# Patient Record
Sex: Female | Born: 1990 | Race: Black or African American | Hispanic: No | Smoking: Former smoker
Health system: Southern US, Community
[De-identification: ages and names within clinical notes are randomized; demographics above are authoritative.]

## PROBLEM LIST (undated history)

## (undated) DIAGNOSIS — F909 Attention-deficit hyperactivity disorder, unspecified type: Secondary | ICD-10-CM

## (undated) DIAGNOSIS — O24419 Gestational diabetes mellitus in pregnancy, unspecified control: Secondary | ICD-10-CM

## (undated) DIAGNOSIS — N920 Excessive and frequent menstruation with regular cycle: Secondary | ICD-10-CM

## (undated) DIAGNOSIS — R51 Headache: Secondary | ICD-10-CM

## (undated) DIAGNOSIS — F32A Depression, unspecified: Secondary | ICD-10-CM

## (undated) DIAGNOSIS — M5430 Sciatica, unspecified side: Secondary | ICD-10-CM

## (undated) DIAGNOSIS — F329 Major depressive disorder, single episode, unspecified: Secondary | ICD-10-CM

## (undated) HISTORY — DX: Major depressive disorder, single episode, unspecified: F32.9

## (undated) HISTORY — PX: NO PAST SURGERIES: SHX2092

## (undated) HISTORY — DX: Excessive and frequent menstruation with regular cycle: N92.0

## (undated) HISTORY — DX: Gestational diabetes mellitus in pregnancy, unspecified control: O24.419

## (undated) HISTORY — DX: Attention-deficit hyperactivity disorder, unspecified type: F90.9

## (undated) HISTORY — DX: Depression, unspecified: F32.A

## (undated) HISTORY — DX: Headache: R51

---
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
## 2010-06-29 ENCOUNTER — Ambulatory Visit: Payer: Self-pay | Admitting: Family Medicine

## 2010-06-29 DIAGNOSIS — N920 Excessive and frequent menstruation with regular cycle: Secondary | ICD-10-CM

## 2010-06-29 DIAGNOSIS — R519 Headache, unspecified: Secondary | ICD-10-CM | POA: Insufficient documentation

## 2010-06-29 DIAGNOSIS — F909 Attention-deficit hyperactivity disorder, unspecified type: Secondary | ICD-10-CM | POA: Insufficient documentation

## 2010-06-29 DIAGNOSIS — R51 Headache: Secondary | ICD-10-CM | POA: Insufficient documentation

## 2010-06-29 DIAGNOSIS — F329 Major depressive disorder, single episode, unspecified: Secondary | ICD-10-CM

## 2010-06-29 DIAGNOSIS — F3289 Other specified depressive episodes: Secondary | ICD-10-CM | POA: Insufficient documentation

## 2010-06-29 HISTORY — DX: Excessive and frequent menstruation with regular cycle: N92.0

## 2010-06-30 LAB — CONVERTED CEMR LAB
ALT: 22 units/L (ref 0–35)
AST: 20 units/L (ref 0–37)
Albumin: 3.8 g/dL (ref 3.5–5.2)
Alkaline Phosphatase: 77 units/L (ref 39–117)
BUN: 8 mg/dL (ref 6–23)
Chloride: 98 meq/L (ref 96–112)
Cholesterol: 154 mg/dL (ref 0–200)
GFR calc non Af Amer: 139.7 mL/min (ref >60)
Glucose, Bld: 81 mg/dL (ref 70–99)
Potassium: 3.9 meq/L (ref 3.5–5.1)
Sodium: 140 meq/L (ref 135–145)

## 2010-07-12 ENCOUNTER — Telehealth: Payer: Self-pay | Admitting: Family Medicine

## 2011-01-02 NOTE — Assessment & Plan Note (Signed)
Summary: NEW PT TO BE ESTABLISHED/JRR   Vital Signs:  Patient profile:   20 year old female Height:      68.25 inches Weight:      221 pounds BMI:     33.48 Temp:     98.3 degrees F oral Pulse rate:   76 / minute Pulse rhythm:   regular BP sitting:   118 / 70  (left arm) Cuff size:   regular  Vitals Entered By: Linde Gillis CMA Duncan Dull) (June 29, 2010 1:59 PM) CC: new patient, establish care Research Study Name: Patient will call us back and give dose of Ritalin   History of Present Illness: 20 yo here to establish care.  1.  Migraine headaches- diagnosed years ago, worsened by stress.  School was stressful and was having 1-2 migraines per week last month, just two this month. Usually unilateral, behind her eye. Associated with photophobia and nausea, no vomiting. No focal neurological deficits. Either takes an excedrin migraine or goes to sleep.  2. ADHD- has been on Ritalin since she was 20 years old, cannot remember dose.  Takes a vacation from it during the summer.  Has been told that she was hyperactive as a child but now her main issue is difficulty concentrating.  3.  Menorrhagia- was on OCPs(cannot remember name).  Helped regulate her periods which have been heavy and irregular.  4.  H/o depression- 2 years ago became so depressed that she attemped suicide (slit her wrist).  Mom immediately took her out of school and to a Haematologist.  She has not recurrent episodes of depression and believes it all stemmed from the group of friends she was interacting with.  5. Well woman- sexually active with boyfriend.  Has been having sex since she was 57 yo.  Has had one pap smear two years ago.  They are using condoms.  Current Medications (verified): 1)  Yaz 3-0.02 Mg  Tabs (Drospirenone-Ethinyl Estradiol) .Marland Kitchen.. 1 Tab By Mouth Daily  Allergies (verified): No Known Drug Allergies  Past History:  Family History: Last updated: 06/29/2010 unremarkable  Social History: Last  updated: 06/29/2010 Moved from [place], [place] with mom, dad and brother 2 years ago. 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  Past Medical History: Headache ADHD Depression s/p suicide attempt in 2009 menorrhagia  Past Surgical History: Denies surgical history  Family History: unremarkable  Social History: Moved from [place], [place] with mom, dad and brother 2 years ago. 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  Review of Systems      See HPI General:  Denies malaise. Eyes:  Denies blurring. ENT:  Denies difficulty swallowing. CV:  Denies chest pain or discomfort. Resp:  Denies shortness of breath. GI:  Denies abdominal pain and change in bowel habits. GU:  Complains of abnormal vaginal bleeding; denies discharge, dysuria, and genital sores. MS:  Denies joint pain, joint redness, and joint swelling. Derm:  Denies rash. Neuro:  Complains of headaches; denies numbness, poor balance, seizures, sensation of room spinning, visual disturbances, and weakness. Psych:  Denies anxiety, depression, sense of great danger, suicidal thoughts/plans, thoughts of violence, unusual visions or sounds, and thoughts /plans of harming others. Endo:  Denies cold intolerance and heat intolerance. Heme:  Denies abnormal bruising and bleeding.  Physical Exam  General:  alert, well-developed, well-nourished, and well-hydrated.  Head:  normocephalic and atraumatic.   Eyes:  vision grossly intact, pupils equal, pupils round, and pupils reactive to light.   Ears:  R ear normal and L ear normal.   Nose:  no external deformity.   Mouth:  good dentition.   Neck:  No deformities, masses, or tenderness noted. Lungs:  Normal respiratory effort, chest expands symmetrically. Lungs are clear to auscultation, no  crackles or wheezes. Heart:  Normal rate and regular rhythm. S1 and S2 normal without gallop, murmur, click, rub or other extra sounds. Abdomen:  Bowel sounds positive,abdomen soft and non-tender without masses, organomegaly or hernias noted. Msk:  No deformity or scoliosis noted of thoracic or lumbar spine.   Extremities:  No clubbing, cyanosis, edema, or deformity noted with normal full range of motion of all joints.   Neurologic:  alert & oriented X3 and gait normal.   Skin:  Intact without suspicious lesions or rashes Psych:  Oriented X3, good eye contact, not anxious appearing, and not depressed appearing.     Impression & Recommendations:  Problem # 1:  HEADACHE (ICD-784.0) Assessment New Classic for migraines.  Discussed importance of NOT taking Excedrin migraine on a regular basis.  Unless she is on OCPs, do not feel comfortable placing her on Immitrex or topomax.  Will start OCPs first and she will follow up in one month. Orders: Venipuncture (16109) TLB-BMP (Basic Metabolic Panel-BMET) (80048-METABOL)  Problem # 2:  DEPRESSION (ICD-311) Assessment: Improved Appears to be adjusting better to her new environment.  Discussed importance of reaching out to family or myself if she feels those symptoms again.  Pt expressed understanding.  Problem # 3:  MENORRHAGIA (ICD-626.2) Assessment: New Will start Yaz, hopefully will help migraines as well. Check FLP today as OCPs are contraindicated with hypertriglyceridemia. Her updated medication list for this problem includes:    Yaz 3-0.02 Mg Tabs (Drospirenone-ethinyl estradiol) .Marland Kitchen... 1 tab by mouth daily  Problem # 4:  ADHD (ICD-314.01) Assessment: Unchanged Stable, will call us with dosage of her Ritalin so we can refill her prescription.  Complete Medication List: 1)  Yaz 3-0.02 Mg Tabs (Drospirenone-ethinyl estradiol) .Marland Kitchen.. 1 tab by mouth daily  Other Orders: TLB-Lipid Panel (80061-LIPID) TLB-Hepatic/Liver Function Pnl  (80076-HEPATIC)  Patient Instructions: 1)  It was very nice to meet you. 2)  Please call me in one month to let me know how you are doing with your headaches. Prescriptions: YAZ 3-0.02 MG  TABS (DROSPIRENONE-ETHINYL ESTRADIOL) 1 tab by mouth daily  #1 pack x 11   Entered and Authorized by:   Ruthe Mannan MD   Signed by:   Ruthe Mannan MD on 06/29/2010   Method used:   Print then Give to Patient   RxID:   [id number]   Prior Medications (reviewed today): None Current Allergies (reviewed today): No known allergies   PAP Result Date:  06/10/2008 PAP Result:  historical PAP Next Due:  2 yr   Appended Document: Orders Update    Clinical Lists Changes  Orders: Added new Service order of New Patient Level IV (95621) - Signed

## 2011-01-02 NOTE — Progress Notes (Signed)
Summary: vyvanse  Phone Note Refill Request Call back at Home Phone [phone] Message from:  Patient on July 12, 2010 4:49 PM  Refills Requested: Medication #1:  VYVANSE 30 MG CAPS take one tablet by mouth daily.  Method Requested: Pick up at Office Initial call taken by: Melody Comas,  July 12, 2010 4:49 PM    Prescriptions: VYVANSE 30 MG CAPS (LISDEXAMFETAMINE DIMESYLATE) take one tablet by mouth daily  #30 x 0   Entered and Authorized by:   Ruthe Mannan MD   Signed by:   Ruthe Mannan MD on 07/13/2010   Method used:   Print then Give to Patient   RxID:   [id number]   Appended Document: vyvanse Left message on machine at home that Rx was ready for pick up will be left at front desk.

## 2011-01-02 NOTE — Miscellaneous (Signed)
Summary: Vaccine Records  Vaccine Records   Imported By: Lanelle Bal 08/30/2010 11:56:52  _____________________________________________________________________  External Attachment:    Type:   Image     Comment:   External Document

## 2011-05-30 ENCOUNTER — Encounter: Payer: Self-pay | Admitting: Family Medicine

## 2011-06-02 ENCOUNTER — Encounter: Payer: Self-pay | Admitting: Family Medicine

## 2011-06-05 ENCOUNTER — Ambulatory Visit (INDEPENDENT_AMBULATORY_CARE_PROVIDER_SITE_OTHER): Admitting: Family Medicine

## 2011-06-05 ENCOUNTER — Encounter: Payer: Self-pay | Admitting: Family Medicine

## 2011-06-05 DIAGNOSIS — R51 Headache: Secondary | ICD-10-CM

## 2011-06-05 DIAGNOSIS — R5381 Other malaise: Secondary | ICD-10-CM

## 2011-06-05 DIAGNOSIS — N920 Excessive and frequent menstruation with regular cycle: Secondary | ICD-10-CM

## 2011-06-05 DIAGNOSIS — N390 Urinary tract infection, site not specified: Secondary | ICD-10-CM | POA: Insufficient documentation

## 2011-06-05 DIAGNOSIS — Z136 Encounter for screening for cardiovascular disorders: Secondary | ICD-10-CM

## 2011-06-05 DIAGNOSIS — R5383 Other fatigue: Secondary | ICD-10-CM | POA: Insufficient documentation

## 2011-06-05 DIAGNOSIS — F909 Attention-deficit hyperactivity disorder, unspecified type: Secondary | ICD-10-CM

## 2011-06-05 DIAGNOSIS — Z0000 Encounter for general adult medical examination without abnormal findings: Secondary | ICD-10-CM | POA: Insufficient documentation

## 2011-06-05 LAB — BASIC METABOLIC PANEL
BUN: 11 mg/dL (ref 6–23)
CO2: 26 mEq/L (ref 19–32)
GFR: 133.92 mL/min (ref >60.00)
Glucose, Bld: 88 mg/dL (ref 70–99)
Potassium: 4 mEq/L (ref 3.5–5.1)
Sodium: 137 mEq/L (ref 135–145)

## 2011-06-05 LAB — LIPID PANEL
HDL: 38.7 mg/dL — ABNORMAL LOW (ref >39.00)
Triglycerides: 24 mg/dL (ref 0.0–149.0)
VLDL: 4.8 mg/dL (ref 0.0–40.0)

## 2011-06-05 LAB — POCT URINALYSIS DIPSTICK
Blood, UA: NEGATIVE
Glucose, UA: NEGATIVE
Ketones, UA: NEGATIVE
Spec Grav, UA: 1.01
Urobilinogen, UA: NEGATIVE

## 2011-06-05 MED ORDER — NORETHIN ACE-ETH ESTRAD-FE 1-20 MG-MCG(24) PO TABS: ORAL_TABLET | ORAL | Status: DC

## 2011-06-05 MED ORDER — SULFAMETHOXAZOLE-TRIMETHOPRIM 800-160 MG PO TABS: 1.0000 | ORAL_TABLET | Freq: Two times a day (BID) | ORAL | Status: AC

## 2011-06-05 NOTE — Patient Instructions (Signed)
Please take Bactrim as directed- 1 tablet twice daily for  3 days. You can start the Loestrin if your pregnancy test is negative in a few weeks. Please call me next month with an update.

## 2011-06-05 NOTE — Progress Notes (Signed)
20 yo here for CPX.  Well woman- sexually active with boyfriend.  Has been having sex since she was 45 yo.  Prescribed Yaz but she didn't take it because of the commercials she saw on TV.    Had a very light period this month. Increased headaches, fatigue, nausea, and  increased urinary frequency. No dysuria or fevers or chills. No CP, blurred vision or SOB. She is concerned she might be pregnant.    Migraine headaches- diagnosed years ago, worsened by stress.   Having a migraine almost daily now. Sometimes associated with photophobia but most frequently not. She has been very nauseated lately. No vomiting.    Patient Active Problem List  Diagnoses  . DEPRESSION  . ADHD  . MENORRHAGIA  . HEADACHE  . Routine general medical examination at a health care facility   Past Medical History  Diagnosis Date  . Headache   . ADHD (attention deficit hyperactivity disorder)   . Depression     suicide attempt  in 2009  . Menorrhagia    No past surgical history on file. History  Substance Use Topics  . Smoking status: Not on file  . Smokeless tobacco: Not on file  . Alcohol Use:    No family history on file. Allergies not on file Current Outpatient Prescriptions on File Prior to Visit  Medication Sig Dispense Refill  . drospirenone-ethinyl estradiol (YAZ) 3-0.02 MG per tablet Take 1 tablet by mouth daily.        Marland Kitchen lisdexamfetamine (VYVANSE) 30 MG capsule Take 30 mg by mouth daily.         The PMH, PSH, Social History, Family History, Medications, and allergies have been reviewed in [facility], and have been updated if relevant.    Review of Systems       See HPI General:  Denies malaise. Eyes:  Denies blurring. ENT:  Denies difficulty swallowing. CV:  Denies chest pain or discomfort. Resp:  Denies shortness of breath. GI:  Denies abdominal pain and change in bowel habits. MS:  Denies joint pain, joint redness, and joint swelling. Derm:  Denies rash. Neuro:  Complains of  headaches; denies numbness, poor balance, seizures, sensation of room spinning, visual disturbances, and weakness. Psych:  Denies anxiety, depression, sense of great danger, suicidal thoughts/plans, thoughts of violence, unusual visions or sounds, and thoughts /plans of harming others. Endo:  Denies cold intolerance and heat intolerance. Heme:  Denies abnormal bruising and bleeding.  Physical Exam BP 110/70  Pulse 73  Temp(Src) 98.4 F (36.9 C) (Oral)  Ht 5\' 8"  (1.727 m)  Wt 242 lb 12 oz (110.111 kg)  BMI 36.91 kg/m2  LMP 05/31/2011  General:  alert, well-developed, well-nourished, and well-hydrated.   Head:  normocephalic and atraumatic.   Eyes:  vision grossly intact, pupils equal, pupils round, and pupils reactive to light.   Ears:  R ear normal and L ear normal.   Nose:  no external deformity.   Mouth:  good dentition.   Neck:  No deformities, masses, or tenderness noted. Lungs:  Normal respiratory effort, chest expands symmetrically. Lungs are clear to auscultation, no crackles or wheezes. Heart:  Normal rate and regular rhythm. S1 and S2 normal without gallop, murmur, click, rub or other extra sounds. Abdomen:  Bowel sounds positive,abdomen soft and non-tender without masses, organomegaly or hernias noted. Msk:  No deformity or scoliosis noted of thoracic or lumbar spine.   Extremities:  No clubbing, cyanosis, edema, or deformity noted with normal full range of  motion of all joints.   Neurologic:  alert & oriented X3 and gait normal.   Skin:  Intact without suspicious lesions or rashes Psych:  Oriented X3, good eye contact, not anxious appearing, and not depressed appearing.    Assessment and Plan: 1. Routine general medical examination at a health care facility    Reviewed preventive care protocols, scheduled due services, and updated immunizations Discussed nutrition, exercise, diet, and healthy lifestyle.   2. MENORRHAGIA  U preg neg. Will start Loestrin   3. ADHD    Stable.   4. Headache  Likely stress headaches with possibly some migraines. Advised against prophylaxis without OCPs as medications like Topomax are teratogenic. The patient indicates understanding of these issues and agrees with the plan.     5. UTI (lower urinary tract infection)  UA positive for UTI. Treat with 3 day course of Bactrim.     6. Fatigue  TSH

## 2012-02-04 ENCOUNTER — Encounter: Payer: Self-pay | Admitting: Family Medicine

## 2012-02-04 ENCOUNTER — Ambulatory Visit (INDEPENDENT_AMBULATORY_CARE_PROVIDER_SITE_OTHER): Admitting: Family Medicine

## 2012-02-04 VITALS — BP 110/70 | HR 68 | Temp 98.2°F | Wt 237.0 lb

## 2012-02-04 DIAGNOSIS — M7540 Impingement syndrome of unspecified shoulder: Secondary | ICD-10-CM | POA: Insufficient documentation

## 2012-02-04 DIAGNOSIS — F909 Attention-deficit hyperactivity disorder, unspecified type: Secondary | ICD-10-CM

## 2012-02-04 HISTORY — DX: Impingement syndrome of unspecified shoulder: M75.40

## 2012-02-04 MED ORDER — NORETHIN ACE-ETH ESTRAD-FE 1-20 MG-MCG(24) PO TABS: ORAL_TABLET | ORAL | Status: DC

## 2012-02-04 MED ORDER — LISDEXAMFETAMINE DIMESYLATE 30 MG PO CAPS: 30.0000 mg | ORAL_CAPSULE | Freq: Every day | ORAL | Status: DC

## 2012-02-04 NOTE — Progress Notes (Signed)
21 yo for medication refill and right arm tingling.  Right arm tingling- started last week but symptoms have resolved. Just right shoulder to fingertips felt numb, she could shake it out. Did start a new job at [employer] a few weeks ago. No UE weakness or arm pain. No CP or SOB. Never had anything like this before.   ADHD- has been taking Vyvance but stopped taking it at her previous job and was concentrating ok. Now that she has a second job, having a harder time focusing. Would like to restart it.   Patient Active Problem List  Diagnoses  . DEPRESSION  . ADHD  . MENORRHAGIA  . HEADACHE  . Routine general medical examination at a health care facility  . UTI (lower urinary tract infection)  . Fatigue  . Impingement syndrome of shoulder   Past Medical History  Diagnosis Date  . Headache   . ADHD (attention deficit hyperactivity disorder)   . Depression     suicide attempt  in 2009  . Menorrhagia    No past surgical history on file. History  Substance Use Topics  . Smoking status: Current Some Day Smoker  . Smokeless tobacco: Not on file  . Alcohol Use: Not on file   No family history on file. No Known Allergies No current outpatient prescriptions on file prior to visit.   The PMH, PSH, Social History, Family History, Medications, and allergies have been reviewed in [facility], and have been updated if relevant.    Review of Systems       See HPI   Physical Exam BP 110/70  Pulse 68  Temp(Src) 98.2 F (36.8 C) (Oral)  Wt 237 lb (107.502 kg)  LMP 02/03/2012  General:  alert, well-developed, well-nourished, and well-hydrated.   Head:  normocephalic and atraumatic.   Msk:  No deformity or scoliosis noted of thoracic or lumbar spine.  Right shoulder- FROM, neg empty can, neg arch  Extremities:  No clubbing, cyanosis, edema, or deformity noted with normal full range of motion of all joints.   Neurologic:  alert & oriented X3 and gait normal.   Skin:  Intact  without suspicious lesions or rashes Psych:  Oriented X3, good eye contact, not anxious appearing, and not depressed appearing.    Assessment and Plan: 1. ADHD  Deteriorated. Will restart Vyvance, rx given.  2. Impingement syndrome of shoulder  New- resolved. Discussed exercises.  Pt to let me know if symptoms return.

## 2012-02-04 NOTE — Patient Instructions (Signed)
Congratulations on your new job! Keep me posted!

## 2014-06-10 ENCOUNTER — Encounter: Payer: Self-pay | Admitting: Internal Medicine

## 2014-06-10 DIAGNOSIS — Z0289 Encounter for other administrative examinations: Secondary | ICD-10-CM

## 2015-12-04 ENCOUNTER — Emergency Department (HOSPITAL_COMMUNITY): Payer: Commercial Managed Care - PPO

## 2015-12-04 ENCOUNTER — Emergency Department (HOSPITAL_COMMUNITY)
Admission: EM | Admit: 2015-12-04 | Discharge: 2015-12-04 | Disposition: A | Discharge status: Home / Self Care | Payer: Commercial Managed Care - PPO | Attending: Emergency Medicine | Admitting: Emergency Medicine

## 2015-12-04 ENCOUNTER — Encounter (HOSPITAL_COMMUNITY): Payer: Self-pay | Admitting: Emergency Medicine

## 2015-12-04 DIAGNOSIS — Y9389 Activity, other specified: Secondary | ICD-10-CM | POA: Diagnosis not present

## 2015-12-04 DIAGNOSIS — Y92410 Unspecified street and highway as the place of occurrence of the external cause: Secondary | ICD-10-CM | POA: Diagnosis not present

## 2015-12-04 DIAGNOSIS — Z8742 Personal history of other diseases of the female genital tract: Secondary | ICD-10-CM | POA: Diagnosis not present

## 2015-12-04 DIAGNOSIS — F17210 Nicotine dependence, cigarettes, uncomplicated: Secondary | ICD-10-CM | POA: Diagnosis not present

## 2015-12-04 DIAGNOSIS — S8992XA Unspecified injury of left lower leg, initial encounter: Secondary | ICD-10-CM | POA: Diagnosis present

## 2015-12-04 DIAGNOSIS — Y998 Other external cause status: Secondary | ICD-10-CM | POA: Diagnosis not present

## 2015-12-04 DIAGNOSIS — Z79818 Long term (current) use of other agents affecting estrogen receptors and estrogen levels: Secondary | ICD-10-CM | POA: Diagnosis not present

## 2015-12-04 DIAGNOSIS — F909 Attention-deficit hyperactivity disorder, unspecified type: Secondary | ICD-10-CM | POA: Insufficient documentation

## 2015-12-04 DIAGNOSIS — Z79899 Other long term (current) drug therapy: Secondary | ICD-10-CM | POA: Insufficient documentation

## 2015-12-04 DIAGNOSIS — S7012XA Contusion of left thigh, initial encounter: Secondary | ICD-10-CM | POA: Diagnosis not present

## 2015-12-04 DIAGNOSIS — Z3202 Encounter for pregnancy test, result negative: Secondary | ICD-10-CM | POA: Diagnosis not present

## 2015-12-04 DIAGNOSIS — S0990XA Unspecified injury of head, initial encounter: Secondary | ICD-10-CM | POA: Diagnosis not present

## 2015-12-04 DIAGNOSIS — T148XXA Other injury of unspecified body region, initial encounter: Secondary | ICD-10-CM

## 2015-12-04 LAB — POC URINE PREG, ED: PREG TEST UR: NEGATIVE

## 2015-12-04 LAB — CBC
HEMATOCRIT: 36.9 % (ref 36.0–46.0)
HEMOGLOBIN: 12.9 g/dL (ref 12.0–15.0)
MCH: 30.6 pg (ref 26.0–34.0)
MCHC: 35 g/dL (ref 30.0–36.0)
MCV: 87.6 fL (ref 78.0–100.0)
Platelets: 294 10*3/uL (ref 150–400)
RBC: 4.21 MIL/uL (ref 3.87–5.11)
RDW: 12.1 % (ref 11.5–15.5)
WBC: 6.6 10*3/uL (ref 4.0–10.5)

## 2015-12-04 LAB — I-STAT CHEM 8, ED
BUN: 7 mg/dL (ref 6–20)
Calcium, Ion: 1.16 mmol/L (ref 1.12–1.23)
Chloride: 103 mmol/L (ref 101–111)
Creatinine, Ser: 0.7 mg/dL (ref 0.44–1.00)
Glucose, Bld: 84 mg/dL (ref 65–99)
HEMATOCRIT: 39 % (ref 36.0–46.0)
HEMOGLOBIN: 13.3 g/dL (ref 12.0–15.0)
POTASSIUM: 3.8 mmol/L (ref 3.5–5.1)
SODIUM: 139 mmol/L (ref 135–145)
TCO2: 26 mmol/L (ref 0–100)

## 2015-12-04 LAB — I-STAT BETA HCG BLOOD, ED (MC, WL, AP ONLY)

## 2015-12-04 MED ORDER — HYDROMORPHONE HCL 1 MG/ML IJ SOLN
1.0000 mg | Freq: Once | INTRAMUSCULAR | Status: AC
  Administered 2015-12-04: 1 mg via INTRAVENOUS
  Filled 2015-12-04: qty 1

## 2015-12-04 MED ORDER — IBUPROFEN 600 MG PO TABS: 600.0000 mg | ORAL_TABLET | Freq: Four times a day (QID) | ORAL | Status: DC | PRN
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
## 2015-12-16 ENCOUNTER — Ambulatory Visit: Admitting: Internal Medicine

## 2015-12-20 ENCOUNTER — Ambulatory Visit: Admitting: Internal Medicine

## 2016-08-13 ENCOUNTER — Emergency Department
Admission: EM | Admit: 2016-08-13 | Discharge: 2016-08-13 | Disposition: A | Discharge status: Home / Self Care | Attending: Emergency Medicine | Admitting: Emergency Medicine

## 2016-08-13 DIAGNOSIS — F17210 Nicotine dependence, cigarettes, uncomplicated: Secondary | ICD-10-CM | POA: Insufficient documentation

## 2016-08-13 DIAGNOSIS — L509 Urticaria, unspecified: Secondary | ICD-10-CM | POA: Insufficient documentation

## 2016-08-13 DIAGNOSIS — F909 Attention-deficit hyperactivity disorder, unspecified type: Secondary | ICD-10-CM | POA: Insufficient documentation

## 2016-08-13 MED ORDER — PREDNISONE 20 MG PO TABS
60.0000 mg | ORAL_TABLET | Freq: Every day | ORAL | 0 refills | Status: DC
Start: 2016-08-13 — End: 2020-09-18

## 2016-08-13 MED ORDER — HYDROXYZINE HCL 25 MG PO TABS: 25.0000 mg | ORAL_TABLET | Freq: Three times a day (TID) | ORAL | 0 refills | Status: DC | PRN

## 2016-08-13 MED ORDER — FAMOTIDINE 20 MG PO TABS
40.0000 mg | ORAL_TABLET | Freq: Once | ORAL | Status: AC
  Administered 2016-08-13: 40 mg via ORAL
  Filled 2016-08-13: qty 2

## 2016-08-13 MED ORDER — HYDROXYZINE HCL 25 MG PO TABS
25.0000 mg | ORAL_TABLET | Freq: Once | ORAL | Status: AC
  Administered 2016-08-13: 25 mg via ORAL
  Filled 2016-08-13: qty 1

## 2016-08-13 MED ORDER — PREDNISONE 20 MG PO TABS
60.0000 mg | ORAL_TABLET | Freq: Once | ORAL | Status: AC
  Administered 2016-08-13: 60 mg via ORAL
  Filled 2016-08-13: qty 3

## 2016-08-13 MED ORDER — CETIRIZINE HCL 10 MG PO TABS: 10.0000 mg | ORAL_TABLET | Freq: Every day | ORAL | 0 refills | Status: DC

## 2016-08-13 NOTE — ED Provider Notes (Signed)
[facility] Emergency Department Provider Note   ____________________________________________   First MD Initiated Contact with Patient 08/13/16 [phone]     (approximate)  I have reviewed the triage vital signs and the nursing notes.   HISTORY  Chief Complaint Rash    HPI Catherine Patterson is a 25 y.o. female who comes into the hospital today with a rash for the past 3 weeks. She reports it is on the back of her legs, back of her shoulders, abdomen and back. She reports that she's been scratching so much that she starting to get scabs. The patient is been taking Benadryl at night to sleep but it is not helping with the itching. She reports that she tried to change her lotion as well as her soaps in her detergent but nothing has helped. The patient is unsure what may have caused the symptoms to began. She denies any shortness of breath any throat closing. She reports that she's been taking her skin which is what makes her concerned. Patient is unable to sleep so she is here for evaluation.She has never had this before.   Past Medical History:  Diagnosis Date  . ADHD (attention deficit hyperactivity disorder)   . Depression    suicide attempt  in 2009  . Headache(784.0)   . Menorrhagia     Patient Active Problem List   Diagnosis Date Noted  . Impingement syndrome of shoulder 02/04/2012  . Routine general medical examination at a health care facility 06/05/2011  . UTI (lower urinary tract infection) 06/05/2011  . Fatigue 06/05/2011  . DEPRESSION 06/29/2010  . ADHD 06/29/2010  . MENORRHAGIA 06/29/2010  . HEADACHE 06/29/2010    No past surgical history  Prior to Admission medications   Medication Sig Start Date End Date Taking? Authorizing Provider  cetirizine (ZYRTEC) 10 MG tablet Take 1 tablet (10 mg total) by mouth daily. 08/13/16   Rebecka Apley, MD  hydrOXYzine (ATARAX/VISTARIL) 25 MG tablet Take 1 tablet (25 mg total) by mouth 3  (three) times daily as needed. 08/13/16   Rebecka Apley, MD  ibuprofen (ADVIL,MOTRIN) 600 MG tablet Take 1 tablet (600 mg total) by mouth every 6 (six) hours as needed. 12/04/15   Derwood Kaplan, MD  lisdexamfetamine (VYVANSE) 30 MG capsule Take 1 capsule (30 mg total) by mouth daily. Patient not taking: Reported on 12/04/2015 02/04/12   Dianne Dun, MD  Norethindrone Acetate-Ethinyl Estrad-FE (LOESTRIN 24 FE) 1-20 MG-MCG(24) tablet 1 tab po daily. Patient not taking: Reported on 12/04/2015 02/04/12   Dianne Dun, MD  predniSONE (DELTASONE) 20 MG tablet Take 3 tablets (60 mg total) by mouth daily. 08/13/16   Rebecka Apley, MD    Allergies Review of patient's allergies indicates no known allergies.  No family history on file.  Social History Social History  Substance Use Topics  . Smoking status: Current Every Day Smoker    Packs/day: 0.15    Types: Cigarettes  . Smokeless tobacco: Not on file  . Alcohol use No    Review of Systems Constitutional: No fever/chills Eyes: No visual changes. ENT: No sore throat. Cardiovascular: Denies chest pain. Respiratory: Denies shortness of breath. Gastrointestinal: No abdominal pain.  No nausea, no vomiting.  No diarrhea.  No constipation. Genitourinary: Negative for dysuria. Musculoskeletal: Negative for back pain. Skin: Itching and rash Neurological: Negative for headaches, focal weakness or numbness.  10-point ROS otherwise negative.  ____________________________________________   PHYSICAL EXAM:  VITAL SIGNS: ED Triage Vitals  Enc  Vitals Group     BP 08/13/16 0305 130/85     Pulse Rate 08/13/16 0305 84     Resp 08/13/16 0305 18     Temp 08/13/16 0305 98.2 F (36.8 C)     Temp Source 08/13/16 0305 Oral     SpO2 08/13/16 0305 99 %     Weight 08/13/16 0307 250 lb (113.4 kg)     Height 08/13/16 0307 5\' 8"  (1.727 m)     Head Circumference --      Peak Flow --      Pain Score --      Pain Loc --      Pain Edu? --      Excl. in  GC? --     Constitutional: Alert and oriented. Well appearing and in Mild distress. Eyes: Conjunctivae are normal. PERRL. EOMI. Head: Atraumatic. Nose: No congestion/rhinnorhea. Mouth/Throat: Mucous membranes are moist.  Oropharynx non-erythematous. Cardiovascular: Normal rate, regular rhythm. Grossly normal heart sounds.  Good peripheral circulation. Respiratory: Normal respiratory effort.  No retractions. Lungs CTAB. Gastrointestinal: Soft and nontender. No distention.  Musculoskeletal: No lower extremity tenderness nor edema.   Neurologic:  Normal speech and language.  Skin:  Skin is warm, dry and intact. Mild hives the patient's arms, back and shoulders, abdomen, back or legs. Excoriated marks as well. No pustules no petechiae. Psychiatric: Mood and affect are normal.   ____________________________________________   LABS (all labs ordered are listed, but only abnormal results are displayed)  Labs Reviewed - No data to display ____________________________________________  EKG  none ____________________________________________  RADIOLOGY  none ____________________________________________   PROCEDURES  Procedure(s) performed: None  Procedures  Critical Care performed: No  ____________________________________________   INITIAL IMPRESSION / ASSESSMENT AND PLAN / ED COURSE  Pertinent labs & imaging results that were available during my care of the patient were reviewed by me and considered in my medical decision making (see chart for details).  This is a 25 year old female who comes into the hospital today with some hives. The patient reports she's been having this for the past 3 weeks. I would give the patient some prednisone, Pepcid and Vistaril. I will then reassess the patient after she's receive the medications.  Clinical Course    At this time we are unsure of the cause of the patient's itching and hives. After the prednisone and Vistaril the patient  reports that her symptoms are improved. I feel that the patient needs to follow-up with an allergist for further evaluation of these hives that she has all over her body. She again has no symptoms of throat closing or shortness of breath. The patient will be discharged home to follow-up with her primary care physician. She is in no acute distress at this time. ____________________________________________   FINAL CLINICAL IMPRESSION(S) / ED DIAGNOSES  Final diagnoses:  Hives  Urticaria      NEW MEDICATIONS STARTED DURING THIS VISIT:  Discharge Medication List as of 08/13/2016  7:20 AM    START taking these medications   Details  cetirizine (ZYRTEC) 10 MG tablet Take 1 tablet (10 mg total) by mouth daily., Starting Mon 08/13/2016, Print    hydrOXYzine (ATARAX/VISTARIL) 25 MG tablet Take 1 tablet (25 mg total) by mouth 3 (three) times daily as needed., Starting Mon 08/13/2016, Print    predniSONE (DELTASONE) 20 MG tablet Take 3 tablets (60 mg total) by mouth daily., Starting Mon 08/13/2016, Print         Note:  This document was prepared  using Conservation officer, historic buildingsDragon voice recognition software and may include unintentional dictation errors.    Rebecka ApleyAllison P Dynasia Kercheval, MD 08/13/16 [phone]

## 2016-08-13 NOTE — ED Notes (Signed)
MD Webster at bedside 

## 2016-08-13 NOTE — ED Triage Notes (Signed)
Pt states that she has had a rash for the past couple weeks, states that she is itching all over. Pt reports taking benadryl yesterday and it helped her sleep, pt denies new products or having spent time with anyone else with a similar rash

## 2017-09-14 ENCOUNTER — Encounter: Payer: Self-pay | Admitting: Emergency Medicine

## 2017-09-14 ENCOUNTER — Emergency Department
Admission: EM | Admit: 2017-09-14 | Discharge: 2017-09-14 | Disposition: A | Discharge status: Home / Self Care | Payer: BLUE CROSS/BLUE SHIELD | Attending: Emergency Medicine | Admitting: Emergency Medicine

## 2017-09-14 DIAGNOSIS — K0889 Other specified disorders of teeth and supporting structures: Secondary | ICD-10-CM | POA: Insufficient documentation

## 2017-09-14 DIAGNOSIS — F17210 Nicotine dependence, cigarettes, uncomplicated: Secondary | ICD-10-CM | POA: Diagnosis not present

## 2017-09-14 MED ORDER — TRAMADOL HCL 50 MG PO TABS: 50.0000 mg | ORAL_TABLET | Freq: Two times a day (BID) | ORAL | 0 refills | Status: DC

## 2017-09-14 MED ORDER — LIDOCAINE-EPINEPHRINE 2 %-1:100000 IJ SOLN
1.7000 mL | Freq: Once | INTRAMUSCULAR | Status: DC
  Filled 2017-09-14: qty 1.7

## 2017-09-14 NOTE — ED Provider Notes (Signed)
[facility] Emergency Department Provider Note ____________________________________________  Time seen: 2101  I have reviewed the triage vital signs and the nursing notes.  HISTORY  Chief Complaint  Dental Pain  HPI Catherine Patterson is a 26 y.o. female Patient presents to the ED for evaluation of left lower dental pain for 1 week. Patient with a history of a primary molar that requires a repeat canal, and describes pain over the last week. She also notes that she is recently been seen by dental provider, for cavity repairs to the upper left molars. Patient denies any fevers, chills, sweats. She also denies any spontaneous drainage. She presents with pain does not improve with Tylenol and Motrin. She does note improvement with cold water and ice to the mouth and jaw.She is also on a current course of amoxicillin for a ear infection.  Past Medical History:  Diagnosis Date  . ADHD (attention deficit hyperactivity disorder)   . Depression    suicide attempt  in 2009  . Headache(784.0)   . Menorrhagia     Patient Active Problem List   Diagnosis Date Noted  . Impingement syndrome of shoulder 02/04/2012  . Routine general medical examination at a health care facility 06/05/2011  . UTI (lower urinary tract infection) 06/05/2011  . Fatigue 06/05/2011  . DEPRESSION 06/29/2010  . ADHD 06/29/2010  . MENORRHAGIA 06/29/2010  . HEADACHE 06/29/2010    History reviewed. No pertinent surgical history.  Prior to Admission medications   Medication Sig Start Date End Date Taking? Authorizing Provider  cetirizine (ZYRTEC) 10 MG tablet Take 1 tablet (10 mg total) by mouth daily. 08/13/16   Rebecka Apley, MD  hydrOXYzine (ATARAX/VISTARIL) 25 MG tablet Take 1 tablet (25 mg total) by mouth 3 (three) times daily as needed. 08/13/16   Rebecka Apley, MD  ibuprofen (ADVIL,MOTRIN) 600 MG tablet Take 1 tablet (600 mg total) by mouth every 6 (six) hours as needed.  12/04/15   Derwood Kaplan, MD  lisdexamfetamine (VYVANSE) 30 MG capsule Take 1 capsule (30 mg total) by mouth daily. Patient not taking: Reported on 12/04/2015 02/04/12   Dianne Dun, MD  Norethindrone Acetate-Ethinyl Estrad-FE (LOESTRIN 24 FE) 1-20 MG-MCG(24) tablet 1 tab po daily. Patient not taking: Reported on 12/04/2015 02/04/12   Dianne Dun, MD  predniSONE (DELTASONE) 20 MG tablet Take 3 tablets (60 mg total) by mouth daily. 08/13/16   Rebecka Apley, MD  traMADol (ULTRAM) 50 MG tablet Take 1 tablet (50 mg total) by mouth 2 (two) times daily. 09/14/17   Ruthia Person, Charlesetta Ivory, PA-C    Allergies Patient has no known allergies.  No family history on file.  Social History Social History  Substance Use Topics  . Smoking status: Current Every Day Smoker    Packs/day: 0.15    Types: Cigarettes  . Smokeless tobacco: Not on file  . Alcohol use No    Review of Systems  Constitutional: Negative for fever. Eyes: Negative for visual changes. ENT: Negative for sore throat. Left lower dental pain as above. Musculoskeletal: Negative for back pain. Skin: Negative for rash. Neurological: Negative for headaches, focal weakness or numbness. ____________________________________________  PHYSICAL EXAM:  VITAL SIGNS: ED Triage Vitals  Enc Vitals Group     BP 09/14/17 1902 (!) 175/111     Pulse Rate 09/14/17 1902 95     Resp 09/14/17 1902 18     Temp 09/14/17 1902 98.4 F (36.9 C)     Temp Source 09/14/17 1902  Oral     SpO2 09/14/17 1902 100 %     Weight 09/14/17 1904 294 lb (133.4 kg)     Height 09/14/17 1904  (1.727 m)     Head Circumference --      Peak Flow --      Pain Score 09/14/17 1902 10     Pain Loc --      Pain Edu? --      Excl. in GC? --     Constitutional: Alert and oriented. Well appearing and in no distress. Head: Normocephalic and atraumatic. Eyes: Conjunctivae are normal. PERRL. Normal extraocular movements Ears: Canals clear. TMs intact  bilaterally. Mouth/Throat: Mucous membranes are moist. Uvula is midline and tonsils are flat. No oropharyngeal lesions are appreciated. Evaluation of the left lower first molar does not reveal any local edema, gum swelling, or pointing fluctuance. No sublingual, brawny erythema is appreciated. Neck: Supple. No thyromegaly. Hematological/Lymphatic/Immunological: No cervical lymphadenopathy. Cardiovascular: Normal rate, regular rhythm. Normal distal pulses. Respiratory: Normal respiratory effort. No wheezes/rales/rhonchi. ____________________________________________  PROCEDURES  DENTAL BLOCK  Performed by: Lissa Hoard Consent: Verbal consent obtained. Required items: devices and special equipment available Time out: Immediately prior to procedure a "time out" was called to verify the correct patient, procedure, equipment, support staff and site/side marked as required.  Indication: pain  Nerve block body site: left lower 1st molar  Preparation: Patient was prepped and draped in the usual sterile fashion. Needle gauge: 27 G Location technique: anatomical landmarks  Local anesthetic: 2%-1:100000 lido w/ epi  Anesthetic total: 1.7 ml  Outcome: pain improved Patient tolerance: Patient tolerated the procedure well with no immediate Complications. ____________________________________________  INITIAL IMPRESSION / ASSESSMENT AND PLAN / ED COURSE  Patient with ED evaluation of acute dental pain to the left lower jaw at the first molar. Patient without any signs of any acute infectious process. She is discharged with a small prescription for Ultram (#10). She will see her dental provider for definitive treatment. Return as needed.  ____________________________________________  FINAL CLINICAL IMPRESSION(S) / ED DIAGNOSES  Final diagnoses:  Pain, dental      Karmen Stabs, Charlesetta Ivory, PA-C 09/14/17 2338    Phineas Semen, MD 09/14/17 2352

## 2017-09-14 NOTE — ED Notes (Signed)
Pt states she had dental work over one week ago performed to left upper and lower jaw. Pt with slight swelling noted to submandibular left lymph nodes. Pt states she has to drink water "all the time" to keep lower jaw pain "better". Pt with multiple filling noted to left upper molars. No obvious swelling or redness noted to gumline. Pt denies fever or drainage from gums.

## 2017-09-14 NOTE — ED Notes (Signed)
Lidocaine with epinephrine given by Dorris Carnes, PA.

## 2017-09-14 NOTE — ED Triage Notes (Signed)
Lower L dental pain x 1 week.

## 2017-09-14 NOTE — Discharge Instructions (Signed)
Take the prescription meds as directed. Follow-up with your provider for definitive treatment.
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
## 2019-12-04 NOTE — L&D Delivery Note (Signed)
Delivery Note  Catherine Patterson is a G1P0 at [redacted]w[redacted]d with an LMP of 04/16/2020, consistent with [place] at [redacted]w[redacted]d.   First Stage: Labor onset: 1100 09/18/2020 Augmentation: N/A Analgesia Eliezer Lofts intrapartum: IVPM pPROM at 0500 09/16/2020  Second Stage: Complete dilation at 1500 Onset of pushing at 1508 FHR second stage - unable to monitor FHT   Catherine Patterson presented to L&D with pPROM, preterm labor, and vaginal bleeding.  Dr. Burnadette Pop to bedside to discuss resuscitation options d/t prematurity and immanent delivery.  After discussing options, risks, and benefits, Catherine Patterson requested to attempt resuscitation.  She had a spontaneous urge to push.  NICU team was in attendance at bedside.   Delivery of a periviable baby boy on 09/18/2020 at 1512 by CNM Delivery of complete breech followed by abdomen, shoulders and fetal head in one push.  Amnionic sac with scant amount of fluid adhered to infant.  Amniotic sac removed from around infant. Cord doubly doubly and cut by CNM.  Infant placed in warmer for assessment and resuscitation by NICU team.    Cord blood sample collection attempted but only able to obtain a scant amount.    Third Stage: Oxytocin bolus started after delivery of infant for hemorrhage prophylaxis  Placenta delivered intact with 3 VC @ 1517 Placenta disposition: to pathology Uterine tone firm / bleeding minimal   No laceration identified  Anesthesia for repair: N/A Repair N/A Est. Blood Loss (mL):  Complications: pPROM, PTL, vaginal bleeding in 2nd trimester, periviable birth at [redacted]w[redacted]d  Mom to postpartum.  Baby to [place].  Newborn: Information for the patient's newborn:  Catherine Patterson, Catherine Patterson [[id number]]  Live born female "Catherine Patterson" Birth Weight:  14.3oz, 405g APGAR: 2, 0  Newborn Delivery   Birth date/time: 09/18/2021 15:12:00 Delivery type:      ---------- Margaretmary Eddy, CNM Certified Nurse Midwife [facility]

## 2020-07-18 DIAGNOSIS — O99210 Obesity complicating pregnancy, unspecified trimester: Secondary | ICD-10-CM | POA: Insufficient documentation

## 2020-07-18 DIAGNOSIS — O99212 Obesity complicating pregnancy, second trimester: Secondary | ICD-10-CM | POA: Insufficient documentation

## 2020-07-19 DIAGNOSIS — Z3490 Encounter for supervision of normal pregnancy, unspecified, unspecified trimester: Secondary | ICD-10-CM | POA: Insufficient documentation

## 2020-08-05 ENCOUNTER — Other Ambulatory Visit: Admission: RE | Admit: 2020-08-05 | Payer: BLUE CROSS/BLUE SHIELD | Source: Ambulatory Visit

## 2020-09-08 ENCOUNTER — Observation Stay
Admission: EM | Admit: 2020-09-08 | Discharge: 2020-09-08 | Disposition: A | Discharge status: Home / Self Care | Payer: Medicaid Other | Attending: Obstetrics & Gynecology | Admitting: Obstetrics & Gynecology

## 2020-09-08 ENCOUNTER — Observation Stay: Payer: Medicaid Other

## 2020-09-08 ENCOUNTER — Other Ambulatory Visit: Payer: Self-pay

## 2020-09-08 DIAGNOSIS — F17210 Nicotine dependence, cigarettes, uncomplicated: Secondary | ICD-10-CM | POA: Diagnosis not present

## 2020-09-08 DIAGNOSIS — Z3A20 20 weeks gestation of pregnancy: Secondary | ICD-10-CM | POA: Diagnosis not present

## 2020-09-08 DIAGNOSIS — O4692 Antepartum hemorrhage, unspecified, second trimester: Secondary | ICD-10-CM | POA: Diagnosis present

## 2020-09-08 DIAGNOSIS — O209 Hemorrhage in early pregnancy, unspecified: Principal | ICD-10-CM | POA: Insufficient documentation

## 2020-09-08 LAB — CHLAMYDIA/NGC RT PCR (ARMC ONLY)
Chlamydia Tr: NOT DETECTED
N gonorrhoeae: NOT DETECTED

## 2020-09-08 LAB — URINALYSIS, ROUTINE W REFLEX MICROSCOPIC
Bacteria, UA: NONE SEEN
Bilirubin Urine: NEGATIVE
Glucose, UA: NEGATIVE mg/dL
Hgb urine dipstick: NEGATIVE
Ketones, ur: NEGATIVE mg/dL
Leukocytes,Ua: NEGATIVE
Nitrite: NEGATIVE
Protein, ur: NEGATIVE mg/dL
Specific Gravity, Urine: 1.016 (ref 1.005–1.030)
pH: 6 (ref 5.0–8.0)

## 2020-09-08 LAB — WET PREP, GENITAL
Clue Cells Wet Prep HPF POC: NONE SEEN
Sperm: NONE SEEN
Trich, Wet Prep: NONE SEEN
WBC, Wet Prep HPF POC: NONE SEEN
Yeast Wet Prep HPF POC: NONE SEEN

## 2020-09-08 NOTE — OB Triage Note (Signed)
Pt states she was at work and went to use the bathroom and had a small amount of bright red bleeding. Pt denies pain, LOF, or fetal movement. Pt states it burns when she urinates when she holds her bladder too long. FHT of 148 upon arrival. VSS. Will continue to monitor.

## 2020-09-08 NOTE — Discharge Summary (Signed)
Catherine Patterson is a 29 y.o. female. She is at [redacted]w[redacted]d gestation. Patient's last menstrual period was 04/16/2020. Estimated Date of Delivery: 01/21/21  Prenatal care site: [facility]    Chief complaint: vaginal bleeding  Location: vagina Onset/timing: sudden, 0100  Duration: once Quality:  Bright red blood Severity: mild Aggravating or alleviating conditions: nothing made better or worse Associated signs/symptoms: increase in frequency of urination, no dysuria, no flank pain, no cramping, no abdominal pain.  No change in BMs or constipation, no hemorrhoids. Context: Patient was at work (night shift) and went to the bathroom, had bright red blood on her tissue when she wiped.  No blood in toilet, now wearing a pad, no blood on it.  She has not had any commotion in her vagina in the last 48hours.  No bleeding prior to this during pregnancy.    Maternal Medical History:   Past Medical History:  Diagnosis Date   ADHD (attention deficit hyperactivity disorder)    Depression    suicide attempt  in 2009   Headache(784.0)    Menorrhagia     History reviewed. No pertinent surgical history.  No Known Allergies  Prior to Admission medications   Medication Sig Start Date End Date Taking? Authorizing Provider  aspirin 81 MG chewable tablet Chew by mouth daily.   Yes [provider]  Prenatal Vit-Fe Fumarate-FA (MULTIVITAMIN-PRENATAL) 27-0.8 MG TABS tablet Take 1 tablet by mouth daily at 12 noon.   Yes [provider]  cetirizine (ZYRTEC) 10 MG tablet Take 1 tablet (10 mg total) by mouth daily. Patient not taking: Reported on 09/08/2020 08/13/16   Rebecka Apley, MD  hydrOXYzine (ATARAX/VISTARIL) 25 MG tablet Take 1 tablet (25 mg total) by mouth 3 (three) times daily as needed. Patient not taking: Reported on 09/08/2020 08/13/16   Rebecka Apley, MD  ibuprofen (ADVIL,MOTRIN) 600 MG tablet Take 1 tablet (600 mg total) by mouth every 6 (six) hours as  needed. Patient not taking: Reported on 09/08/2020 12/04/15   Derwood Kaplan, MD  lisdexamfetamine (VYVANSE) 30 MG capsule Take 1 capsule (30 mg total) by mouth daily. Patient not taking: Reported on 12/04/2015 02/04/12   Dianne Dun, MD  Norethindrone Acetate-Ethinyl Estrad-FE (LOESTRIN 24 FE) 1-20 MG-MCG(24) tablet 1 tab po daily. Patient not taking: Reported on 12/04/2015 02/04/12   Dianne Dun, MD  predniSONE (DELTASONE) 20 MG tablet Take 3 tablets (60 mg total) by mouth daily. Patient not taking: Reported on 09/08/2020 08/13/16   Rebecka Apley, MD  traMADol (ULTRAM) 50 MG tablet Take 1 tablet (50 mg total) by mouth 2 (two) times daily. Patient not taking: Reported on 09/08/2020 09/14/17   Menshew, Charlesetta Ivory, PA-C     Social History: She  reports that she has been smoking cigarettes. She has been smoking about 0.15 packs per day. She has never used smokeless tobacco. She reports that she does not drink alcohol and does not use drugs.  Family History: no history of gyn cancers  Review of Systems: A full review of systems was performed and negative except as noted in the HPI.     O:  BP 119/75 (BP Location: Left Arm)   Pulse 95   Temp 98.6 F (37 C) (Oral)   Resp 16   Ht 5\' 8"  (1.727 m)   Wt (!) 138.8 kg   SpO2 99%   BMI 46.53 kg/m  No results found for this or any previous visit (from the past 48 hour(s)).  Constitutional: NAD, AAOx3  HE/ENT: extraocular movements grossly intact, moist mucous membranes CV: RRR PULM: nl respiratory effort, CTABL     Abd: gravid, non-tender, non-distended, soft      Ext: Non-tender, Nonedmeatous   Psych: mood appropriate, speech normal Pelvic: vulva without lesions or evidence of blood.  Vagina / cervix: normal discharge, no blood or brown discharge Cervix, long, closed, no friability or lesions Uterus non-tender  FHR: 148  Ultrasound:  US OB Limited  Result Date: 09/08/2020 CLINICAL DATA:  Vaginal bleeding. EXAM: LIMITED  OBSTETRIC ULTRASOUND COMPARISON:  No prior. FINDINGS: Number of Fetuses: 1 Heart Rate:  147 bpm Movement: Present Presentation: Breech Placental Location: Posterior. Previa: No.  The placenta appears normal. Amniotic Fluid (Subjective):  Within normal limits. BPD: 4.6 cm 20 w  0 d MATERNAL FINDINGS: Cervix:  Appears closed. Uterus/Adnexae: No abnormality visualized. IMPRESSION: Single viable intrauterine pregnancy at 20 weeks 0 days. Breech presentation. The placenta appears normal. No acute abnormality identified. This exam is performed on an emergent basis and does not comprehensively evaluate fetal size, dating, or anatomy; follow-up complete OB US should be considered if further fetal assessment is warranted. Electronically Signed   By: [name]  Register   On: 09/08/2020 05:49     Assessment: 29 y.o. [redacted]w[redacted]d here for antenatal surveillance during pregnancy.  Principle diagnosis: vaginal bleeding in second trimester  Plan: IUP: reassurance given  Bleeding: cultures collected and pending;   Ultrasound shows no bleeding behind the placenta and no blood in the cervical canal. D/c home stable, precautions reviewed, follow-up as scheduled.   ----- [name], MD Attending Obstetrician and Gynecologist [facility], Department of OB/GYN [facility]

## 2020-09-08 NOTE — Discharge Summary (Signed)
RN reviewed discharge instructions with patient. Gave patient opportunity for questions. All questions answered at this time. Pt verbalized understanding. Pt discharged home with significant other.  °

## 2020-09-18 ENCOUNTER — Encounter: Payer: Self-pay | Admitting: Obstetrics & Gynecology

## 2020-09-18 ENCOUNTER — Inpatient Hospital Stay
Admission: EM | Admit: 2020-09-18 | Discharge: 2020-09-19 | DRG: 807 | Disposition: A | Discharge status: Home / Self Care | Payer: Medicaid Other

## 2020-09-18 DIAGNOSIS — O321XX0 Maternal care for breech presentation, not applicable or unspecified: Secondary | ICD-10-CM | POA: Diagnosis present

## 2020-09-18 DIAGNOSIS — E669 Obesity, unspecified: Secondary | ICD-10-CM | POA: Diagnosis present

## 2020-09-18 DIAGNOSIS — O99214 Obesity complicating childbirth: Secondary | ICD-10-CM | POA: Diagnosis present

## 2020-09-18 DIAGNOSIS — Z3A22 22 weeks gestation of pregnancy: Secondary | ICD-10-CM

## 2020-09-18 DIAGNOSIS — Z20822 Contact with and (suspected) exposure to covid-19: Secondary | ICD-10-CM | POA: Diagnosis present

## 2020-09-18 DIAGNOSIS — O42112 Preterm premature rupture of membranes, onset of labor more than 24 hours following rupture, second trimester: Secondary | ICD-10-CM | POA: Diagnosis present

## 2020-09-18 DIAGNOSIS — F17210 Nicotine dependence, cigarettes, uncomplicated: Secondary | ICD-10-CM | POA: Diagnosis present

## 2020-09-18 DIAGNOSIS — N939 Abnormal uterine and vaginal bleeding, unspecified: Principal | ICD-10-CM

## 2020-09-18 DIAGNOSIS — O4692 Antepartum hemorrhage, unspecified, second trimester: Secondary | ICD-10-CM | POA: Diagnosis present

## 2020-09-18 DIAGNOSIS — O99334 Smoking (tobacco) complicating childbirth: Secondary | ICD-10-CM | POA: Diagnosis present

## 2020-09-18 DIAGNOSIS — O42912 Preterm premature rupture of membranes, unspecified as to length of time between rupture and onset of labor, second trimester: Principal | ICD-10-CM | POA: Diagnosis present

## 2020-09-18 DIAGNOSIS — R109 Unspecified abdominal pain: Secondary | ICD-10-CM | POA: Diagnosis present

## 2020-09-18 DIAGNOSIS — O26892 Other specified pregnancy related conditions, second trimester: Secondary | ICD-10-CM | POA: Diagnosis present

## 2020-09-18 HISTORY — DX: Preterm premature rupture of membranes, onset of labor more than 24 hours following rupture, second trimester: O42.112

## 2020-09-18 LAB — URINALYSIS, COMPLETE (UACMP) WITH MICROSCOPIC
RBC / HPF: >50 RBC/hpf — ABNORMAL HIGH (ref 0–5)
Specific Gravity, Urine: 1.004 — ABNORMAL LOW (ref 1.005–1.030)
Squamous Epithelial / HPF: NONE SEEN (ref 0–5)
WBC, UA: >50 WBC/hpf — ABNORMAL HIGH (ref 0–5)

## 2020-09-18 LAB — TYPE AND SCREEN
ABO/RH(D): A POS
Antibody Screen: NEGATIVE

## 2020-09-18 LAB — COMPREHENSIVE METABOLIC PANEL
ALT: 20 U/L (ref 0–44)
AST: 20 U/L (ref 15–41)
Albumin: 3.2 g/dL — ABNORMAL LOW (ref 3.5–5.0)
Alkaline Phosphatase: 71 U/L (ref 38–126)
Anion gap: 10 (ref 5–15)
BUN: 5 mg/dL — ABNORMAL LOW (ref 6–20)
CO2: 21 mmol/L — ABNORMAL LOW (ref 22–32)
Calcium: 8.5 mg/dL — ABNORMAL LOW (ref 8.9–10.3)
Chloride: 101 mmol/L (ref 98–111)
Creatinine, Ser: 0.61 mg/dL (ref 0.44–1.00)
GFR, Estimated: >60 mL/min (ref >60)
Glucose, Bld: 104 mg/dL — ABNORMAL HIGH (ref 70–99)
Potassium: 3.3 mmol/L — ABNORMAL LOW (ref 3.5–5.1)
Sodium: 132 mmol/L — ABNORMAL LOW (ref 135–145)
Total Bilirubin: 0.5 mg/dL (ref 0.3–1.2)
Total Protein: 7.3 g/dL (ref 6.5–8.1)

## 2020-09-18 LAB — CBC
HCT: 34.3 % — ABNORMAL LOW (ref 36.0–46.0)
Hemoglobin: 12.1 g/dL (ref 12.0–15.0)
MCH: 30.7 pg (ref 26.0–34.0)
MCHC: 35.3 g/dL (ref 30.0–36.0)
MCV: 87.1 fL (ref 80.0–100.0)
Platelets: 275 10*3/uL (ref 150–400)
RBC: 3.94 MIL/uL (ref 3.87–5.11)
RDW: 12 % (ref 11.5–15.5)
WBC: 14.9 10*3/uL — ABNORMAL HIGH (ref 4.0–10.5)
nRBC: 0 % (ref 0.0–0.2)

## 2020-09-18 LAB — ABO/RH: ABO/RH(D): A POS

## 2020-09-18 LAB — PROTIME-INR
INR: 1 (ref 0.8–1.2)
Prothrombin Time: 13 seconds (ref 11.4–15.2)

## 2020-09-18 LAB — RESPIRATORY PANEL BY RT PCR (FLU A&B, COVID)
Influenza A by PCR: NEGATIVE
Influenza B by PCR: NEGATIVE
SARS Coronavirus 2 by RT PCR: NEGATIVE

## 2020-09-18 LAB — APTT: aPTT: 28 seconds (ref 24–36)

## 2020-09-18 LAB — FIBRINOGEN: Fibrinogen: 585 mg/dL — ABNORMAL HIGH (ref 210–475)

## 2020-09-18 MED ORDER — ACETAMINOPHEN 500 MG PO TABS: 1000.0000 mg | ORAL_TABLET | Freq: Four times a day (QID) | ORAL | Status: DC | PRN

## 2020-09-18 MED ORDER — LACTATED RINGERS IV SOLN: 500.0000 mL | INTRAVENOUS | Status: DC | PRN

## 2020-09-18 MED ORDER — FENTANYL CITRATE (PF) 100 MCG/2ML IJ SOLN: 100.0000 ug | Freq: Once | INTRAMUSCULAR | Status: AC

## 2020-09-18 MED ORDER — OXYTOCIN-SODIUM CHLORIDE 30-0.9 UT/500ML-% IV SOLN
INTRAVENOUS | Status: AC
  Filled 2020-09-18: qty 500

## 2020-09-18 MED ORDER — FENTANYL CITRATE (PF) 100 MCG/2ML IJ SOLN
INTRAMUSCULAR | Status: AC
  Administered 2020-09-18: 100 ug via INTRAVENOUS
  Filled 2020-09-18: qty 2

## 2020-09-18 MED ORDER — CALCIUM CARBONATE ANTACID 500 MG PO CHEW: 2.0000 | CHEWABLE_TABLET | ORAL | Status: DC | PRN

## 2020-09-18 MED ORDER — ONDANSETRON HCL 4 MG/2ML IJ SOLN
4.0000 mg | INTRAMUSCULAR | Status: AC
  Administered 2020-09-18: 4 mg via INTRAVENOUS
  Filled 2020-09-18: qty 2

## 2020-09-18 MED ORDER — LACTATED RINGERS IV SOLN: INTRAVENOUS | Status: DC

## 2020-09-18 MED ORDER — OXYTOCIN BOLUS FROM INFUSION
333.0000 mL | Freq: Once | INTRAVENOUS | Status: AC
  Administered 2020-09-18: 333 mL via INTRAVENOUS

## 2020-09-18 MED ORDER — OXYTOCIN-SODIUM CHLORIDE 30-0.9 UT/500ML-% IV SOLN: 2.5000 [IU]/h | INTRAVENOUS | Status: DC

## 2020-09-18 MED ORDER — LACTATED RINGERS IV BOLUS
1000.0000 mL | Freq: Once | INTRAVENOUS | Status: AC
  Administered 2020-09-18: 1000 mL via INTRAVENOUS

## 2020-09-18 MED ORDER — SOD CITRATE-CITRIC ACID 500-334 MG/5ML PO SOLN: 30.0000 mL | ORAL | Status: DC | PRN

## 2020-09-18 MED ORDER — SODIUM CHLORIDE 0.9 % IV SOLN: 8.0000 mg | INTRAVENOUS | Status: DC

## 2020-09-18 NOTE — Discharge Summary (Signed)
Obstetrical Discharge Summary  Patient Name: Catherine Patterson DOB: [birth year] MRN: [id number]  Date of Admission: 09/18/2020 Date of Delivery: 09/18/20 Delivered by: Margaretmary Eddy, CNM  Date of Discharge: 09/19/2020  Primary OB: [facility] YIR:[id number] last menstrual period was 04/16/2020. EDC Estimated Date of Delivery: 01/21/21 Gestational Age at Delivery: [redacted]w[redacted]d   Antepartum complications:  1. Obesity in pregnancy  2. Elevated 1hr GTT - 3hr WNL  Admitting Diagnosis: pPROM, PTL with delivery, vaginal bleeding in 2nd trimester  Secondary Diagnosis: Patient Active Problem List   Diagnosis Date Noted  . Preterm labor in second trimester with preterm delivery 09/19/2020  . Preterm premature rupture of membranes (PPROM) with onset of labor after 24 hours of rupture in second trimester, antepartum 09/18/2020  . Vaginal bleeding in pregnancy, second trimester 09/08/2020  . Supervision of normal pregnancy 07/19/2020  . Obesity affecting pregnancy in second trimester 07/18/2020  . DEPRESSION 06/29/2010  . ADHD 06/29/2010    Augmentation: N/A Complications: Periviable birth, breech presentation Intrapartum complications/course: Catherine Patterson presented to L&D with pPROM, preterm labor, and vaginal bleeding.  Dr. Burnadette Pop to bedside to discuss resuscitation options d/t prematurity and immanent delivery.  After discussing options, risks, and benefits, Catherine Patterson requested to attempt resuscitation.  She had a spontaneous urge to push.  Infant delivered in one push in complete breech presentation, quickly followed by abdomen, shoulders, and head. NICU team attempted resuscitation but unable to get heart rate back despite respiratory support.  Decision made to stop efforts.  Dr. Burnadette Pop brought infant to [place] so that she could hold him.   Delivery Type: spontaneous vaginal delivery Anesthesia: IVPM Placenta: spontaneous Laceration: none  Episiotomy: none Newborn  Data: Live born female "Malachi" Birth Weight:  14.3oz, 405g APGAR: 2, 0  Newborn Delivery   Birth date/time: 09/18/2020 15:12:00 Delivery type:      Postpartum Procedures: none [place]: No flowsheet data found.   Post partum course:  Patient had an uncomplicated postpartum course.  By time of discharge on PPD#1, her pain was controlled on oral pain medications; she had appropriate lochia and was ambulating, voiding without difficulty and tolerating regular diet.  She was deemed stable for discharge to home.    Discharge Physical Exam:  BP 123/67 (BP Location: Left Arm)   Pulse 88   Temp 97.9 F (36.6 C) (Oral)   Resp 16   LMP 04/16/2020   Breastfeeding Unknown   General: NAD CV: RRR Pulm: CTABL, nl effort ABD: s/nd/nt, fundus firm and below the umbilicus Lochia: moderate Perineum:intact DVT Evaluation: LE non-ttp, no evidence of DVT on exam.  Hemoglobin  Date Value Ref Range Status  09/19/2020 11.3 (L) 12.0 - 15.0 g/dL Final   HCT  Date Value Ref Range Status  09/19/2020 32.2 (L) 36 - 46 % Final     Disposition: stable, discharge to home. Baby Disposition: to morgue   Rh Immune globulin given: Rh pos  Rubella vaccine given: Immune Varivax vaccine given: Immune  Flu vaccine given in AP or PP setting: declined  Tdap vaccine given in AP or PP setting: n/a  Contraception: TBD  Prenatal Labs:  Blood type/Rh A pos   Antibody screen neg  Rubella Immune  Varicella Immune  RPR NR  HBsAg Neg  HIV NR  GC neg  Chlamydia neg  Genetic screening negative  Early 1 hour GTT 171  3 hour GTT 85, 162, 131, 98  GBS Unknown    Plan:  Magaret Coates-[name] was discharged to home in good condition. Follow-up appointment  with delivering provider in 6 weeks.  Discharge Medications: Allergies as of 09/19/2020   No Known Allergies     Medication List    TAKE these medications   ibuprofen 800 MG tablet Commonly known as: ADVIL Take 1 tablet (800 mg total) by  mouth every 8 (eight) hours as needed for mild pain or moderate pain.   multivitamin-prenatal 27-0.8 MG Tabs tablet Take 1 tablet by mouth daily at 12 noon.        Follow-up Information    [facility]. Schedule an appointment as soon as possible for a visit in 2 week(s).   Why: postpartum mood check  Contact information: [address] [place] [address] [phone]       Gustavo Lah, CNM. Schedule an appointment as soon as possible for a visit in 6 week(s).   Specialty: Certified Nurse Midwife Why: postpartum visit  Contact information: [address] [place] [address] [phone]               Signed: Cyril Mourning  09/19/2020 8:26 AM

## 2020-09-18 NOTE — H&P (Signed)
OB History & Physical   History of Present Illness:  Chief Complaint:   HPI:  Marshelle Bilger is a 29 y.o. G1P0 female at [redacted]w[redacted]d dated by LMP of 04/16/2020, c/w [place] at [redacted]w[redacted]d.  She presents to L&D for vaginal bleeding and abdominal pain.  States pain she noticed bloody-mucus like discharge after voiding.  Intermittent abdominal pain started at 1100 this morning.  She denies any abdominal trauma, falls, or blunt force trauma.  Alahna reports LOF that started on 09/16/2020 around 0500 but thought she was just having urinary leakage.    Unsure of fetal movement - has not felt any since abdominal pain started  Contractions: every 3 to 5 minutes, started at 1100 LOF/SROM: Reports leaking that started 0500 on 09/16/2020 Vaginal bleeding: period-like vaginal bleeding present with multiple small clots   Pregnancy Issues: 1. Obesity in pregnancy - BMI 45 2. Elevated early 1hr GTT - 3hr WNL  Patient Active Problem List   Diagnosis Date Noted  . Abdominal pain during pregnancy in second trimester 09/18/2020  . Preterm premature rupture of membranes (PPROM) with onset of labor after 24 hours of rupture in second trimester, antepartum 09/18/2020  . Vaginal bleeding in pregnancy, second trimester 09/08/2020  . Supervision of normal pregnancy 07/19/2020  . Obesity affecting pregnancy in second trimester 07/18/2020  . DEPRESSION 06/29/2010  . ADHD 06/29/2010     Maternal Medical History:   Past Medical History:  Diagnosis Date  . ADHD (attention deficit hyperactivity disorder)   . Depression    suicide attempt  in 2009  . Headache(784.0)   . Impingement syndrome of shoulder 02/04/2012  . Menorrhagia   . MENORRHAGIA 06/29/2010   Qualifier: Diagnosis of  By: Dayton Martes MD, Talia      No past surgical history on file.  No Known Allergies  Prior to Admission medications   Medication Sig Start Date End Date Taking? Authorizing Provider  aspirin 81 MG chewable tablet Chew by mouth daily.     [provider]  cetirizine (ZYRTEC) 10 MG tablet Take 1 tablet (10 mg total) by mouth daily. Patient not taking: Reported on 09/08/2020 08/13/16   Rebecka Apley, MD  hydrOXYzine (ATARAX/VISTARIL) 25 MG tablet Take 1 tablet (25 mg total) by mouth 3 (three) times daily as needed. Patient not taking: Reported on 09/08/2020 08/13/16   Rebecka Apley, MD  ibuprofen (ADVIL,MOTRIN) 600 MG tablet Take 1 tablet (600 mg total) by mouth every 6 (six) hours as needed. Patient not taking: Reported on 09/08/2020 12/04/15   Derwood Kaplan, MD  lisdexamfetamine (VYVANSE) 30 MG capsule Take 1 capsule (30 mg total) by mouth daily. Patient not taking: Reported on 12/04/2015 02/04/12   Dianne Dun, MD  Norethindrone Acetate-Ethinyl Estrad-FE (LOESTRIN 24 FE) 1-20 MG-MCG(24) tablet 1 tab po daily. Patient not taking: Reported on 12/04/2015 02/04/12   Dianne Dun, MD  predniSONE (DELTASONE) 20 MG tablet Take 3 tablets (60 mg total) by mouth daily. Patient not taking: Reported on 09/08/2020 08/13/16   Rebecka Apley, MD  Prenatal Vit-Fe Fumarate-FA (MULTIVITAMIN-PRENATAL) 27-0.8 MG TABS tablet Take 1 tablet by mouth daily at 12 noon.    [provider]  traMADol (ULTRAM) 50 MG tablet Take 1 tablet (50 mg total) by mouth 2 (two) times daily. Patient not taking: Reported on 09/08/2020 09/14/17   Menshew, Charlesetta Ivory, PA-C     Prenatal care site:  [facility]  Social History: She  reports that she has been smoking cigarettes. She  has been smoking about 0.15 packs per day. She has never used smokeless tobacco. She reports that she does not drink alcohol and does not use drugs.  Family History: family history is not on file.   Review of Systems: A full review of systems was performed and negative except as noted in the HPI.     Physical Exam:  Vital Signs: BP (!) 115/58   Pulse (!) 106   Temp 99.8 F (37.7 C) (Oral)   LMP 04/16/2020  Physical Exam  General: acute distress,  tearful, holding abdomen and rocking side to side in bed  HEENT: normocephalic, atraumatic Heart: regular rate & rhythm.  No murmurs/rubs/gallops Lungs: clear to auscultation bilaterally, normal respiratory effort, tachypnea  Abdomen: soft, gravid, tender;  EFW: 400 grams  Pelvic:   External: Normal external female genitalia  Cervix: Dilation: 4 / Effacement (%): 100 / Presenting part of breech and feet presenting through cervix.    Extremities: non-tender, symmetric, no edema bilaterally.  Neurologic: Alert & oriented x 3 Psych: anxious   Results for orders placed or performed during the hospital encounter of 09/18/20 (from the past 24 hour(s))  CBC     Status: Abnormal   Collection Time: 09/18/20  2:29 PM  Result Value Ref Range   WBC 14.9 (H) 4.0 - 10.5 K/uL   RBC 3.94 3.87 - 5.11 MIL/uL   Hemoglobin 12.1 12.0 - 15.0 g/dL   HCT 10.1 (L) 36 - 46 %   MCV 87.1 80.0 - 100.0 fL   MCH 30.7 26.0 - 34.0 pg   MCHC 35.3 30.0 - 36.0 g/dL   RDW 75.1 02.5 - 85.2 %   Platelets 275 150 - 400 K/uL   nRBC 0.0 0.0 - 0.2 %  Comprehensive metabolic panel     Status: Abnormal   Collection Time: 09/18/20  2:29 PM  Result Value Ref Range   Sodium 132 (L) 135 - 145 mmol/L   Potassium 3.3 (L) 3.5 - 5.1 mmol/L   Chloride 101 98 - 111 mmol/L   CO2 21 (L) 22 - 32 mmol/L   Glucose, Bld 104 (H) 70 - 99 mg/dL   BUN 5 (L) 6 - 20 mg/dL   Creatinine, Ser 7.78 0.44 - 1.00 mg/dL   Calcium 8.5 (L) 8.9 - 10.3 mg/dL   Total Protein 7.3 6.5 - 8.1 g/dL   Albumin 3.2 (L) 3.5 - 5.0 g/dL   AST 20 15 - 41 U/L   ALT 20 0 - 44 U/L   Alkaline Phosphatase 71 38 - 126 U/L   Total Bilirubin 0.5 0.3 - 1.2 mg/dL   GFR, Estimated >24 >23 mL/min   Anion gap 10 5 - 15  Type and screen [facility]     Status: None   Collection Time: 09/18/20  2:29 PM  Result Value Ref Range   ABO/RH(D) A POS    Antibody Screen NEG    Sample Expiration      09/21/2020,2359 Performed at [facility], [address] [place], [place] [address]   Respiratory Panel by RT PCR (Flu A&B, Covid) - Nasopharyngeal Swab     Status: None   Collection Time: 09/18/20  2:34 PM   Specimen: Nasopharyngeal Swab  Result Value Ref Range   SARS Coronavirus 2 by RT PCR NEGATIVE NEGATIVE   Influenza A by PCR NEGATIVE NEGATIVE   Influenza B by PCR NEGATIVE NEGATIVE    Pertinent Results:  Prenatal Labs: Blood type/Rh A Pos  Antibody screen neg  Rubella Immune  Varicella Immune  RPR NR  HBsAg Neg  HIV NR  GC neg  Chlamydia neg  Genetic screening negative  Early 1 hour GTT 171  3 hour GTT 85, 162, 131, 98  GBS Unknown    FHT: Unable to auscultate FHT with doppler, cardiac activity visualized with bedside US  TOCO: moderate contractions palpated every 2-4 minutes  SVE:  Dilation: 4 / Effacement (%): 100 / breech and feet palpated presenting through the cervix    Breech by [place] and SVE   US OB Limited  Result Date: 09/08/2020 CLINICAL DATA:  Vaginal bleeding. EXAM: LIMITED OBSTETRIC ULTRASOUND COMPARISON:  No prior. FINDINGS: Number of Fetuses: 1 Heart Rate:  147 bpm Movement: Present Presentation: Breech Placental Location: Posterior. Previa: No.  The placenta appears normal. Amniotic Fluid (Subjective):  Within normal limits. BPD: 4.6 cm 20 w  0 d MATERNAL FINDINGS: Cervix:  Appears closed. Uterus/Adnexae: No abnormality visualized. IMPRESSION: Single viable intrauterine pregnancy at 20 weeks 0 days. Breech presentation. The placenta appears normal. No acute abnormality identified. This exam is performed on an emergent basis and does not comprehensively evaluate fetal size, dating, or anatomy; follow-up complete OB US should be considered if further fetal assessment is warranted. Electronically Signed   By: [name]  Register   On: 09/08/2020 05:49    Assessment:  Kandyce Dieguez is a 29 y.o. G1P0 female at [redacted]w[redacted]d with pPROM, PTL, and vaginal bleeding.   Plan:  1. Admit to Labor & Delivery;  consents reviewed and obtained - Covid admission screen  - Dr. Elesa Massed notified of admission and plan of care discussed  - Dr. Burnadette Pop with SCN notified of admission, requested for consult d/t prematurity.   2. Fetal Well being  - Fetal cardiac activity seen with [place]  - Presentation: breech confirmed by [place] and SVE   - Discussed resuscitation options with Dr. Burnadette Pop and Reche Dixon.  Options given to attempt resuscitation and transport to tertiary center if efforts were successful.  Also given option for comfort care after birth.  Reviewed risks and benefits of both options.  Sahian desires to attempt resuscitation.  NICU team notified and warmer prepared.     3. Routine OB: - Prenatal labs reviewed, as above - Rh pos - CBC, T&S, RPR on admit - Torch labs, CMP, PT, PTT, and FDP  - Clear fluids, IVF  4. Monitoring of labor  -  Contractions monitored by palpation  -  Plan for expectant management  -  Maternal pain control as desired; planning IVPM - Fentanyl given for pain  - Anticipate vaginal delivery  5. Post Partum Planning: - Contraception: TBD - Flu vaccine: declined  - Will send placenta to pathology   Gustavo Lah, CNM 09/18/20  Margaretmary Eddy, CNM Certified Nurse Midwife [facility]

## 2020-09-18 NOTE — Consult Note (Addendum)
[facility]   Prenatal Neonatology Consult       09/18/2020  3:43 PM   I was asked by Dr. Elesa Massed to consult on this patient for impending preterm delivery at 22+[redacted] weeks gestation.  Dates determined to be accurate by LMP and early 6 week ultrasound. This was a brief consult in nature due to mom's active labor and the immanent delivery status.  Mom reports leaking of fluid for the last 3 days.  There is also concern for placental abruption with vaginal bleeding.  She has not received any betamethasone, magnesium, or antibiotics.   I explained that the overall survival rate for infant's born at [redacted] weeks gestation is extremely low, around 10%, and that survival without significant neurologic impairment and disability is even lower, probably around 1%.  We discussed that there was a possibility I would not be able to place a breathing tube due to the infant's small size, and we also discussed that even if I were to be able to place a breathing tube, the infant may still not have enough cardiac circulation to survive.  If he was able to be stabilized, he would require transport to a tertiary hospital and would have a very long hospitalization.   I explained that knowing the above information, some families would elect not to even attempt resuscitation and instead hold their baby immediately after delivery until he/she passed.  Other families may choose critical care measures in hopes of their baby's survival despite the known poor outcomes.  Ms. Forrester immediately responded that she wanted to try critical care measures.  I repeated and clarified her decision, making sure she understood the chance of survival with poor neurologic outcomes. She again affirmed the desire for critical care measures.   As delivery was immanent, the NICU team started to prep for resuscitation.   Time for consultation approximately 30 minutes.   _____________________ Electronically Signed By: Karie Schwalbe,  MD, MS Neonatologist

## 2020-09-18 NOTE — Discharge Instructions (Signed)
Vaginal Delivery, Care After This sheet gives you information about how to care for yourself after your delivery. Your health care provider may also give you more specific instructions. If you have problems or questions, contact your health care provider. What can I expect after the procedure? After delivery, it is common to have:  Soreness in your abdomen, your vagina, and the area between the opening of your vagina and your anus (perineum).  Tiredness (fatigue).  Cramps.  Breast tenderness related to engorgement.  Some bleeding and discharge from your vagina. This may continue for about 6 weeks. The bleeding and discharge will start out red, then become pink, then yellow, and finally white.  Tenderness in your vagina or perineum if you had an episiotomy or a vaginal tear. This may last several weeks.  Emotions that change quickly. Common emotions include: ? Sadness. ? Anger. ? Denial. ? Guilt. ? Depression. Follow these instructions at home: Vaginal and perineal care  Keep your perineum clean and dry as told by your health care provider.  Wipe from front to back when you use the toilet.  If you have an episiotomy or a vaginal tear, check for signs of infection, such as: ? Increasing redness, swelling, or pain in your perineal area. ? Pus or bad-smelling discharge coming from your wound or vagina.  To relieve pain at the wound area or pain caused by hemorrhoids, try taking a warm sitz bath 2-3 times a day.  Do not use tampons or douche until your health care provider says it is okay. Medicines  Take over-the-counter and prescription medicines only as told by your health care provider.  If you were prescribed an antibiotic medicine, take it as told by your health care provider. Do not stop taking the antibiotic even if you start to feel better. Eating and drinking   Drink enough fluids to keep your urine pale yellow.  Eat high-fiber foods every day. These foods may help  prevent or relieve constipation. High-fiber foods include: ? Whole grain cereals and breads. ? Brown rice. ? Beans. ? Fresh fruits and vegetables. Activity  If possible, have someone help you with your household activities for at least a few days after you leave the hospital.  Return to your normal activities as told by your health care provider. Ask your health care provider what activities are safe for you.  Rest as much as possible.  Talk with your health care provider about when you can engage in sexual activity. This may depend on your: ? Risk of infection. ? Rate of healing. ? Comfort and desire to engage in sexual activity. Emotional support  Consider seeking support for your loss. Some forms of support that you might consider include your religious leader, friends, family, a professional counselor, or a bereavement support group. General instructions  Wear a supportive and well-fitting bra.  If you pass a blood clot, save it and call your health care provider to discuss. Do not flush blood clots down the toilet before you get instructions from your health care provider.  Keep all of your scheduled postpartum visits. At these visits, your health care provider will check to make sure that you are healing, both physically and emotionally. Contact a health care provider if:  You feel sad or depressed.  You are having trouble eating or sleeping.  You lose interest in activities you used to enjoy.  You pass a blood clot from your vagina.  You have pus or a bad-smelling discharge coming from   your wound or vagina.  You have increasing redness, swelling, or pain in your perineal area.  You feel pain or burning when you urinate.  You urinate more often than normal.  You have a fever.  Your breasts become hard, red, or painful.  You are dizzy or light-headed.  You have a rash.  You feel nauseous or you vomit.  You have not had a menstrual period by the 12th week  after delivery. Get help right away if:  You have persistent pain that is not relieved by comfort measures or medicines.  You have chest pain or trouble breathing.  You have blurred vision or you see spots.  You have a severe headache.  You faint.  You have sudden, severe leg pain.  You bleed from your vagina so much that you fill more than one sanitary pad in one hour. Bleeding should not be heavier than your heaviest period.  You have thoughts of hurting yourself. If you ever feel like you may hurt yourself or others, or have thoughts about taking your own life, get help right away. You can go to your nearest emergency department or call:  Your local emergency services (911 in the [place]  A suicide crisis helpline, such as the National Suicide Prevention Lifeline at 1-800-273-8255. This is open 24 hours a day. Summary  Take over-the-counter and prescription medicines only as told by your health care provider.  Return to your normal activities as told by your health care provider. Ask your health care provider what activities are safe for you.  Consider getting support for your loss. Sources of support include religious leaders, friends, family, professional counselors, and bereavement support groups.  Keep all follow-up visits as told by your health care provider. This is important. This information is not intended to replace advice given to you by your health care provider. Make sure you discuss any questions you have with your health care provider. Document Revised: 12/04/2017 Document Reviewed: 02/28/2017 Elsevier Patient Education  2020 Elsevier Inc.  

## 2020-09-19 ENCOUNTER — Encounter: Payer: Self-pay | Admitting: Obstetrics & Gynecology

## 2020-09-19 ENCOUNTER — Other Ambulatory Visit: Payer: Self-pay

## 2020-09-19 LAB — CBC
HCT: 32.2 % — ABNORMAL LOW (ref 36.0–46.0)
Hemoglobin: 11.3 g/dL — ABNORMAL LOW (ref 12.0–15.0)
MCH: 31.1 pg (ref 26.0–34.0)
MCHC: 35.1 g/dL (ref 30.0–36.0)
MCV: 88.7 fL (ref 80.0–100.0)
Platelets: 242 10*3/uL (ref 150–400)
RBC: 3.63 MIL/uL — ABNORMAL LOW (ref 3.87–5.11)
RDW: 12.2 % (ref 11.5–15.5)
WBC: 15.5 10*3/uL — ABNORMAL HIGH (ref 4.0–10.5)
nRBC: 0 % (ref 0.0–0.2)

## 2020-09-19 LAB — GROUP B STREP BY PCR: Group B strep by PCR: NEGATIVE

## 2020-09-19 LAB — RPR: RPR Ser Ql: NONREACTIVE

## 2020-09-19 MED ORDER — IBUPROFEN 800 MG PO TABS: 800.0000 mg | ORAL_TABLET | Freq: Three times a day (TID) | ORAL | 0 refills | Status: DC | PRN

## 2020-09-19 MED ORDER — IBUPROFEN 800 MG PO TABS
800.0000 mg | ORAL_TABLET | Freq: Four times a day (QID) | ORAL | Status: DC | PRN
  Administered 2020-09-19: 800 mg via ORAL
  Filled 2020-09-19: qty 1

## 2020-09-19 NOTE — Progress Notes (Signed)
CH paged to LDR to support pt. and family in the wake of fetal demise; when [facility] arrived, pt. sitting up in bed, very tearful, holding her son Catherine Patterson's body wrapped in a small blanket.  Pt.'s mother at bedside providing comfort and praying over pt. in her grief.  Shortly after, pt.'s father Catherine Patterson arrived, hugged pt. and prayed over her at bedside.  Per father, pt. is his eldest daughter and Catherine Patterson is her first child; Father also tearful but expressed several times his firm belief that 'God makes no mistakes' and that God would somehow carry their family through this loss.  Family grateful for [facility] presence but seemed to have what they needed at this time; CH excused himself to allow family time together but remains available as needed.

## 2020-09-19 NOTE — Plan of Care (Signed)
Pt. Recovering from vaginal delivery. Pt. Bonding with baby appropriately. Expresses interest in keepsakes related to baby. Pain being managed with medication. Pt. States that she has received chaplain consult and is coping with loss. Pt. Able to ambulate without challenges. Pt. Received information regarding potential signs of infection as well as information regarding appropriate bleeding after discharge.

## 2020-09-19 NOTE — Progress Notes (Signed)
Patient given discharge instructions with support person. RN gave patient and father of baby birth certificate verification of facts, memory box with baby's belongings, SD card of pictures, and flowers. RN called office and made appointment for October 29 at 1100 with Margaretmary Eddy, CNM. Patient verbalized understanding and states that she has no other questions at this time. RN discharged pt via wheelchair with father of baby.

## 2020-09-20 LAB — INFECT DISEASE AB IGM REFLEX 1

## 2020-09-20 LAB — TORCH-IGM(TOXO/ RUB/ CMV/ HSV) W TITER
CMV IgM: <30 AU/mL (ref 0.0–29.9)
HSVI/II Comb IgM: <0.91 Ratio (ref 0.00–0.90)
Rubella IgM: <20 AU/mL (ref 0.0–19.9)
Toxoplasma Antibody- IgM: <3 AU/mL (ref 0.0–7.9)

## 2020-09-20 LAB — URINE CULTURE: Culture: <10000 — AB

## 2020-09-20 LAB — SURGICAL PATHOLOGY

## 2020-09-21 ENCOUNTER — Other Ambulatory Visit: Payer: Self-pay

## 2020-09-21 ENCOUNTER — Encounter: Payer: Self-pay | Admitting: Emergency Medicine

## 2020-09-21 DIAGNOSIS — Z3A22 22 weeks gestation of pregnancy: Secondary | ICD-10-CM | POA: Diagnosis not present

## 2020-09-21 DIAGNOSIS — Z5321 Procedure and treatment not carried out due to patient leaving prior to being seen by health care provider: Secondary | ICD-10-CM | POA: Diagnosis not present

## 2020-09-21 DIAGNOSIS — R42 Dizziness and giddiness: Secondary | ICD-10-CM | POA: Insufficient documentation

## 2020-09-21 DIAGNOSIS — O26852 Spotting complicating pregnancy, second trimester: Secondary | ICD-10-CM | POA: Insufficient documentation

## 2020-09-21 DIAGNOSIS — N939 Abnormal uterine and vaginal bleeding, unspecified: Secondary | ICD-10-CM | POA: Insufficient documentation

## 2020-09-21 LAB — BASIC METABOLIC PANEL
Anion gap: 8 (ref 5–15)
BUN: 7 mg/dL (ref 6–20)
CO2: 23 mmol/L (ref 22–32)
Calcium: 8.2 mg/dL — ABNORMAL LOW (ref 8.9–10.3)
Chloride: 107 mmol/L (ref 98–111)
Creatinine, Ser: 0.66 mg/dL (ref 0.44–1.00)
GFR, Estimated: >60 mL/min (ref >60)
Glucose, Bld: 99 mg/dL (ref 70–99)
Potassium: 3.5 mmol/L (ref 3.5–5.1)
Sodium: 138 mmol/L (ref 135–145)

## 2020-09-21 LAB — TYPE AND SCREEN
ABO/RH(D): A POS
Antibody Screen: NEGATIVE

## 2020-09-21 LAB — CBC
HCT: 31.8 % — ABNORMAL LOW (ref 36.0–46.0)
Hemoglobin: 11.1 g/dL — ABNORMAL LOW (ref 12.0–15.0)
MCH: 31.2 pg (ref 26.0–34.0)
MCHC: 34.9 g/dL (ref 30.0–36.0)
MCV: 89.3 fL (ref 80.0–100.0)
Platelets: 257 10*3/uL (ref 150–400)
RBC: 3.56 MIL/uL — ABNORMAL LOW (ref 3.87–5.11)
RDW: 12.1 % (ref 11.5–15.5)
WBC: 7.3 10*3/uL (ref 4.0–10.5)
nRBC: 0 % (ref 0.0–0.2)

## 2020-09-21 NOTE — ED Triage Notes (Signed)
Pt to ED from home c/o vaginal bleeding since the 17th.  States lost her pregnancy at 22wks on the 17th, bleeding had slowed but started back up, not filling a pad's worth but had 3 clots and two were golf ball sized.  States dizziness especially when walking around.  Pt A&Ox4, chest rise even and unlabored, in NAD at this time.

## 2020-09-22 ENCOUNTER — Emergency Department
Admission: EM | Admit: 2020-09-22 | Discharge: 2020-09-22 | Disposition: A | Discharge status: Home / Self Care | Payer: Medicaid Other | Attending: Emergency Medicine | Admitting: Emergency Medicine

## 2020-09-22 NOTE — ED Notes (Signed)
No answer when called from lobby 

## 2020-09-27 LAB — AEROBIC/ANAEROBIC CULTURE W GRAM STAIN (SURGICAL/DEEP WOUND)

## 2021-02-07 DIAGNOSIS — O0992 Supervision of high risk pregnancy, unspecified, second trimester: Secondary | ICD-10-CM | POA: Insufficient documentation

## 2021-06-06 ENCOUNTER — Encounter (HOSPITAL_COMMUNITY): Payer: Self-pay

## 2021-06-06 ENCOUNTER — Ambulatory Visit (HOSPITAL_COMMUNITY)
Admission: EM | Admit: 2021-06-06 | Discharge: 2021-06-06 | Disposition: A | Discharge status: Home / Self Care | Payer: Medicaid Other | Attending: Urgent Care | Admitting: Urgent Care

## 2021-06-06 ENCOUNTER — Other Ambulatory Visit: Payer: Self-pay

## 2021-06-06 DIAGNOSIS — B349 Viral infection, unspecified: Secondary | ICD-10-CM | POA: Diagnosis not present

## 2021-06-06 DIAGNOSIS — Z87891 Personal history of nicotine dependence: Secondary | ICD-10-CM | POA: Diagnosis not present

## 2021-06-06 DIAGNOSIS — Z28310 Unvaccinated for covid-19: Secondary | ICD-10-CM | POA: Insufficient documentation

## 2021-06-06 DIAGNOSIS — Z20822 Contact with and (suspected) exposure to covid-19: Secondary | ICD-10-CM | POA: Insufficient documentation

## 2021-06-06 DIAGNOSIS — R5383 Other fatigue: Secondary | ICD-10-CM | POA: Insufficient documentation

## 2021-06-06 DIAGNOSIS — R1110 Vomiting, unspecified: Secondary | ICD-10-CM | POA: Insufficient documentation

## 2021-06-06 DIAGNOSIS — R5381 Other malaise: Secondary | ICD-10-CM | POA: Diagnosis not present

## 2021-06-06 DIAGNOSIS — R519 Headache, unspecified: Secondary | ICD-10-CM | POA: Diagnosis not present

## 2021-06-06 LAB — POC INFLUENZA A AND B ANTIGEN (URGENT CARE ONLY)
INFLUENZA A ANTIGEN, POC: NEGATIVE
INFLUENZA B ANTIGEN, POC: NEGATIVE

## 2021-06-06 LAB — SARS CORONAVIRUS 2 (TAT 6-24 HRS): SARS Coronavirus 2: NEGATIVE

## 2021-06-06 MED ORDER — OSELTAMIVIR PHOSPHATE 75 MG PO CAPS: 75.0000 mg | ORAL_CAPSULE | Freq: Two times a day (BID) | ORAL | 0 refills | Status: DC

## 2021-06-06 MED ORDER — ONDANSETRON 8 MG PO TBDP: 8.0000 mg | ORAL_TABLET | Freq: Three times a day (TID) | ORAL | 0 refills | Status: DC | PRN

## 2021-06-06 NOTE — ED Triage Notes (Signed)
Pt presents with generalized body aches, headache, vomiting, and fatigue since waking up this morning.

## 2021-06-06 NOTE — ED Provider Notes (Signed)
[facility]   MRN: [id number] DOB: 04-26-1991  Subjective:   Analyce Tavares is a 30 y.o. female presenting for acute onset this morning moderate to severe body aches, vomiting, fatigue and malaise, headache.  Patient is not COVID vaccinated, no flu vaccine.  Denies fever, confusion, dizziness, numbness or tingling, neck pain, neck stiffness, chest pain, shortness of breath, cough, abdominal pain.  His medications for relief.  Denies being pregnant as she has not been sexually active in months.  Does not care for testing.  No current facility-administered medications for this encounter.  Current Outpatient Medications:    ibuprofen (ADVIL) 800 MG tablet, Take 1 tablet (800 mg total) by mouth every 8 (eight) hours as needed for mild pain or moderate pain., Disp: 30 tablet, Rfl: 0   Prenatal Vit-Fe Fumarate-FA (MULTIVITAMIN-PRENATAL) 27-0.8 MG TABS tablet, Take 1 tablet by mouth daily at 12 noon., Disp: , Rfl:    No Known Allergies  Past Medical History:  Diagnosis Date   ADHD (attention deficit hyperactivity disorder)    Depression    suicide attempt  in 2009   Headache(784.0)    Impingement syndrome of shoulder 02/04/2012   Menorrhagia    MENORRHAGIA 06/29/2010   Qualifier: Diagnosis of  By: Dayton Martes MD, Talia       History reviewed. No pertinent surgical history.  Family History  Family history unknown: Yes    Social History   Tobacco Use   Smoking status: Former    Packs/day: 0.15    Pack years: 0.00    Types: Cigarettes    Quit date: 09/20/2015    Years since quitting: 5.7   Smokeless tobacco: Never  Vaping Use   Vaping Use: Former   Start date: 09/19/2016   Quit date: 09/19/2018   Substances: Nicotine  Substance Use Topics   Alcohol use: No   Drug use: No    ROS   Objective:   Vitals: BP (!) 104/57 (BP Location: Right Arm)   Pulse (!) 103   Temp 99.5 F (37.5 C) (Oral)   Resp 20   LMP  (LMP Unknown)   SpO2 96%   Physical  Exam Constitutional:      General: She is not in acute distress.    Appearance: Normal appearance. She is well-developed. She is not ill-appearing, toxic-appearing or diaphoretic.  HENT:     Head: Normocephalic and atraumatic.     Right Ear: External ear normal.     Left Ear: External ear normal.     Nose: Nose normal.     Mouth/Throat:     Mouth: Mucous membranes are moist.     Pharynx: No oropharyngeal exudate or posterior oropharyngeal erythema.  Eyes:     General: No scleral icterus.       Right eye: No discharge.        Left eye: No discharge.     Extraocular Movements: Extraocular movements intact.     Conjunctiva/sclera: Conjunctivae normal.     Pupils: Pupils are equal, round, and reactive to light.  Cardiovascular:     Rate and Rhythm: Normal rate and regular rhythm.     Pulses: Normal pulses.     Heart sounds: Normal heart sounds. No murmur heard.   No friction rub. No gallop.  Pulmonary:     Effort: Pulmonary effort is normal. No respiratory distress.     Breath sounds: Normal breath sounds. No stridor. No wheezing, rhonchi or rales.  Skin:    General: Skin  is warm and dry.     Findings: No rash.  Neurological:     General: No focal deficit present.     Mental Status: She is alert and oriented to person, place, and time.     Cranial Nerves: No cranial nerve deficit.     Motor: No weakness.     Coordination: Coordination normal.     Gait: Gait normal.     Deep Tendon Reflexes: Reflexes normal.  Psychiatric:        Mood and Affect: Mood normal.        Behavior: Behavior normal.        Thought Content: Thought content normal.        Judgment: Judgment normal.   Results for orders placed or performed during the hospital encounter of 06/06/21 (from the past 24 hour(s))  POC Influenza A & B Ag (Urgent Care)     Status: None   Collection Time: 06/06/21  3:19 PM  Result Value Ref Range   INFLUENZA A ANTIGEN, POC NEGATIVE NEGATIVE   INFLUENZA B ANTIGEN, POC NEGATIVE  NEGATIVE      Assessment and Plan :   PDMP not reviewed this encounter.  1. Viral syndrome   2. Fatigue, unspecified type     Will cover for influenza with Tamiflu given symptom set, acute onset, physical exam findings.  Use supportive care, rest, fluids, hydration, light meals, schedule Tylenol and ibuprofen.  COVID-19 testing is pending.  Counseled patient on potential for adverse effects with medications prescribed today, patient verbalized understanding. ER and return-to-clinic precautions discussed, patient verbalized understanding.    Wallis Bamberg, [place] 06/06/21 1531

## 2021-06-06 NOTE — Discharge Instructions (Addendum)
We will manage this as a viral illness. For sore throat or cough try using a honey-based tea. Use 3 teaspoons of honey with juice squeezed from half lemon. Place shaved pieces of ginger into 1/2-1 cup of water and warm over stove top. Then mix the ingredients and repeat every 4 hours as needed. Please take ibuprofen 600mg every 6 hours with food alternating with OR taken together with Tylenol 500mg-650mg every 6 hours for throat pain, fevers, aches and pains. Hydrate very well with at least 2 liters of water. Eat light meals such as soups (chicken and noodles, vegetable, chicken and wild rice).  Do not eat foods that you are allergic to.  Taking an antihistamine like Zyrtec, Allegra or Claritin can help against postnasal drainage, sinus congestion.  You can take this together with pseudoephedrine (Sudafed) at a dose of 60 mg 3 times a day or twice daily as needed for the same kind of nasal drip, congestion.  However, limit your use of pseudoephedrine if you have high blood pressure or avoid altogether if you have abnormal heart rhythms, heart condition. 

## 2022-01-08 ENCOUNTER — Other Ambulatory Visit: Payer: Self-pay

## 2022-01-08 ENCOUNTER — Ambulatory Visit
Admission: EM | Admit: 2022-01-08 | Discharge: 2022-01-08 | Disposition: A | Discharge status: Home / Self Care | Payer: Medicaid Other | Attending: Internal Medicine | Admitting: Internal Medicine

## 2022-01-08 DIAGNOSIS — B372 Candidiasis of skin and nail: Secondary | ICD-10-CM

## 2022-01-08 LAB — POCT URINE PREGNANCY: Preg Test, Ur: NEGATIVE

## 2022-01-08 MED ORDER — CLOTRIMAZOLE 1 % EX CREA: TOPICAL_CREAM | CUTANEOUS | 0 refills | Status: DC

## 2022-01-08 MED ORDER — FLUCONAZOLE 150 MG PO TABS: 150.0000 mg | ORAL_TABLET | ORAL | 0 refills | Status: DC

## 2022-01-08 NOTE — ED Triage Notes (Signed)
Pt presents with Rash that started on R groin approx one month ago.  Spread to L groin and now under both breasts.  Has been applying a ringworm and another cream but it is not helping.  Feels like it may be going to back.  No new detergent, sheets, soap etc. Not painful, just very itchy.

## 2022-01-08 NOTE — Discharge Instructions (Addendum)
You have been prescribed a pill and cream to help alleviate yeast infection of the skin.  Please follow-up if symptoms persist or worsen.

## 2022-01-08 NOTE — ED Provider Notes (Signed)
[facility]    CSN: [id number] Arrival date & time: 01/08/22  7169      History   Chief Complaint Chief Complaint  Patient presents with   Rash    HPI Catherine Patterson is a 31 y.o. female.   Patient presents with a rash to bilateral groin, underneath bilateral breast, back that has been present for approximately 1 month.  Patient reports that she has used over-the-counter antifungal creams with no improvement.  Rash is itchy but not painful.  Denies any known fevers.  Denies changes in soaps, detergents, lotions, foods, etc.   Rash  Past Medical History:  Diagnosis Date   ADHD (attention deficit hyperactivity disorder)    Depression    suicide attempt  in 2009   Headache(784.0)    Impingement syndrome of shoulder 02/04/2012   Menorrhagia    MENORRHAGIA 06/29/2010   Qualifier: Diagnosis of  By: Dayton Martes MD, Talia      Patient Active Problem List   Diagnosis Date Noted   Preterm labor in second trimester with preterm delivery 09/19/2020   Preterm premature rupture of membranes (PPROM) with onset of labor after 24 hours of rupture in second trimester, antepartum 09/18/2020   Vaginal bleeding in pregnancy, second trimester 09/08/2020   Supervision of normal pregnancy 07/19/2020   Obesity affecting pregnancy in second trimester 07/18/2020   DEPRESSION 06/29/2010   ADHD 06/29/2010    History reviewed. No pertinent surgical history.  OB History     Gravida  1   Para  1   Term      Preterm  1   AB      Living  1      SAB      IAB      Ectopic      Multiple  0   Live Births  1            Home Medications    Prior to Admission medications   Medication Sig Start Date End Date Taking? Authorizing Provider  clotrimazole (LOTRIMIN) 1 % cream Apply to affected area 2 times daily 01/08/22  Yes Kimila Papaleo, [place] E, FNP  fluconazole (DIFLUCAN) 150 MG tablet Take 1 tablet (150 mg total) by mouth every 3 (three) days. Take first pill today.  May  take second pill in 3 days if no resolution of symptoms with first pill.  May take third pill 3 days after second pill if no resolution of symptoms with the second pill. 01/08/22  Yes Mung Rinker, Rolly Salter E, FNP  ibuprofen (ADVIL) 800 MG tablet Take 1 tablet (800 mg total) by mouth every 8 (eight) hours as needed for mild pain or moderate pain. 09/19/20   Haroldine Laws, CNM  ondansetron (ZOFRAN-ODT) 8 MG disintegrating tablet Take 1 tablet (8 mg total) by mouth every 8 (eight) hours as needed for nausea or vomiting. 06/06/21   Wallis Bamberg, PA-C  oseltamivir (TAMIFLU) 75 MG capsule Take 1 capsule (75 mg total) by mouth 2 (two) times daily. 06/06/21   Wallis Bamberg, PA-C  Prenatal Vit-Fe Fumarate-FA (MULTIVITAMIN-PRENATAL) 27-0.8 MG TABS tablet Take 1 tablet by mouth daily at 12 noon.    [provider]    Family History Family History  Family history unknown: Yes    Social History Social History   Tobacco Use   Smoking status: Former    Packs/day: 0.15    Types: Cigarettes    Quit date: 09/20/2015    Years since quitting: 6.3   Smokeless tobacco: Never  Vaping Use   Vaping Use: Former   Start date: 09/19/2016   Quit date: 09/19/2018   Substances: Nicotine  Substance Use Topics   Alcohol use: No   Drug use: No     Allergies   Patient has no known allergies.   Review of Systems Review of Systems Per HPI  Physical Exam Triage Vital Signs ED Triage Vitals  Enc Vitals Group     BP 01/08/22 1116 (!) 144/96     Pulse Rate 01/08/22 1116 85     Resp 01/08/22 1116 18     Temp 01/08/22 1116 98.1 F (36.7 C)     Temp Source 01/08/22 1116 Oral     SpO2 01/08/22 1116 100 %     Weight --      Height --      Head Circumference --      Peak Flow --      Pain Score 01/08/22 1113 0     Pain Loc --      Pain Edu? --      Excl. in GC? --    No data found.  Updated Vital Signs BP (!) 144/96 (BP Location: Right Arm)    Pulse 85    Temp 98.1 F (36.7 C) (Oral)    Resp 18    SpO2  100%    Breastfeeding No   Visual Acuity Right Eye Distance:   Left Eye Distance:   Bilateral Distance:    Right Eye Near:   Left Eye Near:    Bilateral Near:     Physical Exam Exam conducted with a chaperone present.  Constitutional:      General: She is not in acute distress.    Appearance: Normal appearance. She is not toxic-appearing or diaphoretic.  HENT:     Head: Normocephalic and atraumatic.  Eyes:     Extraocular Movements: Extraocular movements intact.     Conjunctiva/sclera: Conjunctivae normal.  Pulmonary:     Effort: Pulmonary effort is normal.  Skin:    Comments: Erythema with satellite like lesions present to bilateral groin, underneath right breast that extends slightly to the mid chest, and mid lower back.  No signs of bacterial infection.   Neurological:     General: No focal deficit present.     Mental Status: She is alert and oriented to person, place, and time. Mental status is at baseline.  Psychiatric:        Mood and Affect: Mood normal.        Behavior: Behavior normal.        Thought Content: Thought content normal.        Judgment: Judgment normal.     UC Treatments / Results  Labs (all labs ordered are listed, but only abnormal results are displayed) Labs Reviewed  POCT URINE PREGNANCY    EKG   Radiology No results found.  Procedures Procedures (including critical care time)  Medications Ordered in UC Medications - No data to display  Initial Impression / Assessment and Plan / UC Course  I have reviewed the triage vital signs and the nursing notes.  Pertinent labs & imaging results that were available during my care of the patient were reviewed by me and considered in my medical decision making (see chart for details).     Physical exam is consistent with candidiasis of skin.  Will treat with Diflucan and clotrimazole cream.  Advised patient that treatment can take up to 2 to 3 weeks.  Patient  to follow-up if symptoms persist  or worsen.  Patient verbalized understanding was agreeable with plan. Final Clinical Impressions(s) / UC Diagnoses   Final diagnoses:  Candidal skin infection     Discharge Instructions      You have been prescribed a pill and cream to help alleviate yeast infection of the skin.  Please follow-up if symptoms persist or worsen.    ED Prescriptions     Medication Sig Dispense Auth. Provider   fluconazole (DIFLUCAN) 150 MG tablet Take 1 tablet (150 mg total) by mouth every 3 (three) days. Take first pill today.  May take second pill in 3 days if no resolution of symptoms with first pill.  May take third pill 3 days after second pill if no resolution of symptoms with the second pill. 3 tablet Roslyn, [place] E, [place]   clotrimazole (LOTRIMIN) 1 % cream Apply to affected area 2 times daily 15 g Ferguson, Acie Fredrickson, [place]      PDMP not reviewed this encounter.   Gustavus Bryant, [place] 01/08/22 1143

## 2022-08-04 ENCOUNTER — Inpatient Hospital Stay (HOSPITAL_COMMUNITY)
Admission: AD | Admit: 2022-08-04 | Discharge: 2022-08-04 | Disposition: A | Discharge status: Home / Self Care | Payer: Medicaid Other | Attending: Obstetrics and Gynecology | Admitting: Obstetrics and Gynecology

## 2022-08-04 ENCOUNTER — Ambulatory Visit
Admission: RE | Admit: 2022-08-04 | Discharge: 2022-08-04 | Disposition: A | Discharge status: Home / Self Care | Payer: Medicaid Other | Source: Ambulatory Visit | Attending: Family Medicine | Admitting: Family Medicine

## 2022-08-04 ENCOUNTER — Encounter: Payer: Self-pay | Admitting: Obstetrics and Gynecology

## 2022-08-04 ENCOUNTER — Encounter (HOSPITAL_COMMUNITY): Payer: Self-pay | Admitting: Obstetrics and Gynecology

## 2022-08-04 ENCOUNTER — Inpatient Hospital Stay (HOSPITAL_COMMUNITY): Payer: Medicaid Other

## 2022-08-04 VITALS — BP 128/83 | HR 104 | Temp 99.3°F | Resp 16 | Ht 68.0 in | Wt 315.0 lb

## 2022-08-04 DIAGNOSIS — Z3490 Encounter for supervision of normal pregnancy, unspecified, unspecified trimester: Secondary | ICD-10-CM | POA: Diagnosis not present

## 2022-08-04 DIAGNOSIS — R102 Pelvic and perineal pain: Secondary | ICD-10-CM

## 2022-08-04 DIAGNOSIS — O3680X0 Pregnancy with inconclusive fetal viability, not applicable or unspecified: Secondary | ICD-10-CM | POA: Insufficient documentation

## 2022-08-04 DIAGNOSIS — O26891 Other specified pregnancy related conditions, first trimester: Secondary | ICD-10-CM | POA: Diagnosis present

## 2022-08-04 DIAGNOSIS — Z3A01 Less than 8 weeks gestation of pregnancy: Secondary | ICD-10-CM | POA: Insufficient documentation

## 2022-08-04 DIAGNOSIS — M549 Dorsalgia, unspecified: Secondary | ICD-10-CM

## 2022-08-04 DIAGNOSIS — O200 Threatened abortion: Secondary | ICD-10-CM | POA: Insufficient documentation

## 2022-08-04 DIAGNOSIS — M545 Low back pain, unspecified: Secondary | ICD-10-CM | POA: Diagnosis not present

## 2022-08-04 HISTORY — DX: Sciatica, unspecified side: M54.30

## 2022-08-04 LAB — POCT URINALYSIS DIP (MANUAL ENTRY)
Bilirubin, UA: NEGATIVE
Blood, UA: NEGATIVE
Glucose, UA: NEGATIVE mg/dL
Ketones, POC UA: NEGATIVE mg/dL
Leukocytes, UA: NEGATIVE
Nitrite, UA: NEGATIVE
Protein Ur, POC: NEGATIVE mg/dL
Spec Grav, UA: >=1.03 — AB (ref 1.010–1.025)
Urobilinogen, UA: 1 E.U./dL
pH, UA: 6 (ref 5.0–8.0)

## 2022-08-04 LAB — URINALYSIS, ROUTINE W REFLEX MICROSCOPIC
Bacteria, UA: NONE SEEN
Bilirubin Urine: NEGATIVE
Glucose, UA: NEGATIVE mg/dL
Hgb urine dipstick: NEGATIVE
Ketones, ur: NEGATIVE mg/dL
Leukocytes,Ua: NEGATIVE
Nitrite: NEGATIVE
Protein, ur: NEGATIVE mg/dL
Specific Gravity, Urine: 1.021 (ref 1.005–1.030)
pH: 6 (ref 5.0–8.0)

## 2022-08-04 LAB — HCG, QUANTITATIVE, PREGNANCY: hCG, Beta Chain, Quant, S: 51 m[IU]/mL — ABNORMAL HIGH (ref <5)

## 2022-08-04 LAB — CBC
HCT: 35.9 % — ABNORMAL LOW (ref 36.0–46.0)
Hemoglobin: 12.4 g/dL (ref 12.0–15.0)
MCH: 30.2 pg (ref 26.0–34.0)
MCHC: 34.5 g/dL (ref 30.0–36.0)
MCV: 87.6 fL (ref 80.0–100.0)
Platelets: 333 10*3/uL (ref 150–400)
RBC: 4.1 MIL/uL (ref 3.87–5.11)
RDW: 12 % (ref 11.5–15.5)
WBC: 7.9 10*3/uL (ref 4.0–10.5)
nRBC: 0 % (ref 0.0–0.2)

## 2022-08-04 LAB — WET PREP, GENITAL
Clue Cells Wet Prep HPF POC: NONE SEEN
Sperm: NONE SEEN
Trich, Wet Prep: NONE SEEN
WBC, Wet Prep HPF POC: >=10 — AB (ref <10)
Yeast Wet Prep HPF POC: NONE SEEN

## 2022-08-04 LAB — HIV ANTIBODY (ROUTINE TESTING W REFLEX): HIV Screen 4th Generation wRfx: NONREACTIVE

## 2022-08-04 LAB — POCT URINE PREGNANCY: Preg Test, Ur: POSITIVE — AB

## 2022-08-04 MED ORDER — CYCLOBENZAPRINE HCL 10 MG PO TABS: 10.0000 mg | ORAL_TABLET | Freq: Two times a day (BID) | ORAL | 0 refills | Status: DC | PRN

## 2022-08-04 MED ORDER — CYCLOBENZAPRINE HCL 5 MG PO TABS
10.0000 mg | ORAL_TABLET | Freq: Once | ORAL | Status: AC
  Administered 2022-08-04: 10 mg via ORAL
  Filled 2022-08-04: qty 2

## 2022-08-04 NOTE — ED Provider Notes (Signed)
[facility]    CSN: [id number] Arrival date & time: 08/04/22  5366      History   Chief Complaint Chief Complaint  Patient presents with   Back Pain    HPI Catherine Patterson is a 31 y.o. female.   HPI  Patient is here for evaluation of low back pain.  She states that has been going on for about 12 days.  She also has intermittent pressure and pain in her lower abdomen/pelvic area.  No urinary frequency.  No dysuria.  No fever or chills.  No nausea or vomiting.  No vaginal discharge or suspicion of vaginal infection.  She does have unprotected sexual relations.  She does desire pregnancy She states she has been pregnant twice before.  1 pregnancy ended in early miscarriage, the second baby was born premature at 22 weeks and did not survive.  Past Medical History:  Diagnosis Date   ADHD (attention deficit hyperactivity disorder)    Depression    suicide attempt  in 2009   Headache(784.0)    Impingement syndrome of shoulder 02/04/2012   Menorrhagia    MENORRHAGIA 06/29/2010   Qualifier: Diagnosis of  By: Dayton Martes MD, Talia      Patient Active Problem List   Diagnosis Date Noted   Preterm labor in second trimester with preterm delivery 09/19/2020   Preterm premature rupture of membranes (PPROM) with onset of labor after 24 hours of rupture in second trimester, antepartum 09/18/2020   Vaginal bleeding in pregnancy, second trimester 09/08/2020   Supervision of normal pregnancy 07/19/2020   Obesity affecting pregnancy in second trimester 07/18/2020   DEPRESSION 06/29/2010   ADHD 06/29/2010    History reviewed. No pertinent surgical history.  OB History     Gravida  1   Para  1   Term      Preterm  1   AB      Living  1      SAB      IAB      Ectopic      Multiple  0   Live Births  1            Home Medications    Prior to Admission medications   Not on File    Family History Family History  Family history unknown: Yes     Social History Social History   Tobacco Use   Smoking status: Former    Packs/day: 0.15    Types: Cigarettes    Quit date: 09/20/2015    Years since quitting: 6.8   Smokeless tobacco: Never  Vaping Use   Vaping Use: Former   Start date: 09/19/2016   Quit date: 09/19/2018   Substances: Nicotine  Substance Use Topics   Alcohol use: No   Drug use: No     Allergies   Cherry   Review of Systems Review of Systems  See HPI Physical Exam Triage Vital Signs ED Triage Vitals  Enc Vitals Group     BP 08/04/22 0838 128/83     Pulse Rate 08/04/22 0838 (!) 104     Resp 08/04/22 0838 16     Temp 08/04/22 0838 99.3 F (37.4 C)     Temp Source 08/04/22 0838 Oral     SpO2 08/04/22 0838 97 %     Weight 08/04/22 0841 (!) 315 lb (142.9 kg)     Height 08/04/22 0841 5\' 8"  (1.727 m)     Head Circumference --  Peak Flow --      Pain Score 08/04/22 0840 10     Pain Loc --      Pain Edu? --      Excl. in GC? --    No data found.  Updated Vital Signs BP 128/83 (BP Location: Left Arm)   Pulse (!) 104   Temp 99.3 F (37.4 C) (Oral)   Resp 16   Ht 5\' 8"  (1.727 m)   Wt (!) 142.9 kg   LMP 07/03/2022   SpO2 97%   Breastfeeding No   BMI 47.90 kg/m      Physical Exam Constitutional:      General: She is not in acute distress.    Appearance: She is well-developed. She is obese.  HENT:     Head: Normocephalic and atraumatic.  Cardiovascular:     Rate and Rhythm: Normal rate.  Pulmonary:     Effort: Pulmonary effort is normal. No respiratory distress.  Musculoskeletal:        General: Normal range of motion.     Cervical back: Normal range of motion.     Right lower leg: No edema.     Left lower leg: No edema.  Skin:    General: Skin is warm and dry.  Neurological:     Mental Status: She is alert.  Psychiatric:        Mood and Affect: Mood normal.        Behavior: Behavior normal.      UC Treatments / Results  Labs (all labs ordered are listed, but  only abnormal results are displayed) Labs Reviewed  POCT URINALYSIS DIP (MANUAL ENTRY) - Abnormal; Notable for the following components:      Result Value   Spec Grav, UA >=1.030 (*)    All other components within normal limits  POCT URINE PREGNANCY - Abnormal; Notable for the following components:   Preg Test, Ur Positive (*)    All other components within normal limits    EKG   Radiology No results found.  Procedures Procedures (including critical care time)  Medications Ordered in UC Medications - No data to display  Initial Impression / Assessment and Plan / UC Course  I have reviewed the triage vital signs and the nursing notes.  Pertinent labs & imaging results that were available during my care of the patient were reviewed by me and considered in my medical decision making (see chart for details).     Patient referred to [facility] for further evaluation Final Clinical Impressions(s) / UC Diagnoses   Final diagnoses:  Acute left-sided low back pain without sciatica  Pregnancy at early stage     Discharge Instructions      Go to [facility] hospital   ED Prescriptions   None    PDMP not reviewed this encounter.   [facility], MD 08/04/22 [phone]

## 2022-08-04 NOTE — ED Triage Notes (Addendum)
Low back pain x 12 days Pain worse this week  Pelvic pressure - sharp  Hurts to walk  Pain is 10/10 Last menses last month  OTC tylenol 1500mg  at night  Here w/ boyfriend

## 2022-08-04 NOTE — MAU Provider Note (Signed)
History     CSN: [id number]  Arrival date and time: 08/04/22 1033   Event Date/Time   First Provider Initiated Contact with Patient 08/04/22 1227      Chief Complaint  Patient presents with   Back Pain   HPI [name] is a 31 y.o. year old G66P0101 female at [redacted]w[redacted]d weeks gestation who presents to MAU reporting lower LT back pain and pelvic cramping x 1 week. Now only lower back pain that causes her to not be able to sleep through the night. She has been taking Tylenol 15000 mg twice daily for the back pain and it relieves the pain most of the times. With the pelvic cramping and the back pain was so bad, she "knew something was not right" and went to Urgent Care for treatment. She was told at Urgent Care that she was pregnant and needed to be seen in MAU instead. She denies any urinary or vaginal complaints. She has not started [facility]. The FOB is present and contributing to the history taking.   OB History     Gravida  2   Para  1   Term      Preterm  1   AB      Living  1      SAB      IAB      Ectopic      Multiple  0   Live Births  1           Past Medical History:  Diagnosis Date   ADHD (attention deficit hyperactivity disorder)    Depression    suicide attempt  in 2009   Headache(784.0)    Impingement syndrome of shoulder 02/04/2012   Menorrhagia    MENORRHAGIA 06/29/2010   Qualifier: Diagnosis of  By: Dayton Martes MD, Talia     Sciatica     Past Surgical History:  Procedure Laterality Date   NO PAST SURGERIES      Family History  Family history unknown: Yes    Social History   Tobacco Use   Smoking status: Former    Packs/day: 0.15    Types: Cigarettes    Quit date: 09/20/2015    Years since quitting: 6.8   Smokeless tobacco: Never  Vaping Use   Vaping Use: Former   Start date: 09/19/2016   Quit date: 09/19/2018   Substances: Nicotine  Substance Use Topics   Alcohol use: No   Drug use: No    Allergies:  Allergies   Allergen Reactions   Cherry Itching and Swelling    Throat swells    Medications Prior to Admission  Medication Sig Dispense Refill Last Dose   acetaminophen (TYLENOL) 500 MG tablet Take 1,500 mg by mouth every 6 (six) hours as needed.   08/03/2022    Review of Systems  Constitutional: Negative.   HENT: Negative.    Eyes: Negative.   Respiratory: Negative.    Cardiovascular: Negative.   Gastrointestinal: Negative.   Endocrine: Negative.   Genitourinary:  Positive for pelvic pain (cramping).  Musculoskeletal:  Positive for back pain.  Skin: Negative.   Allergic/Immunologic: Negative.   Neurological: Negative.   Psychiatric/Behavioral: Negative.     Physical Exam   Blood pressure 137/81, pulse 92, temperature 98.3 F (36.8 C), temperature source Oral, resp. rate 14, height 5\' 8"  (1.727 m), weight (!) 145.6 kg, last menstrual period 07/03/2022, SpO2 98 %, not currently breastfeeding.  Physical Exam Vitals and nursing note reviewed.  Constitutional:  Appearance: Normal appearance. She is obese.  Pulmonary:     Effort: Pulmonary effort is normal.  Abdominal:     Palpations: Abdomen is soft.  Genitourinary:    Comments: Wet Prep and GC/CT collected by blind swab technique by patient Musculoskeletal:        General: Normal range of motion.  Skin:    General: Skin is warm and dry.  Neurological:     Mental Status: She is alert and oriented to person, place, and time.  Psychiatric:        Mood and Affect: Mood normal.        Behavior: Behavior normal.        Thought Content: Thought content normal.        Judgment: Judgment normal.    MAU Course  Procedures  MDM CCUA UPT CBC ABO/Rh HCG Wet Prep GC/CT -- pending HIV -- pending OB < 14 wks [place] with TV  Results for orders placed or performed during the hospital encounter of 08/04/22 (from the past 24 hour(s))  Urinalysis, Routine w reflex microscopic Urine, Clean Catch     Status: None   Collection Time:  08/04/22 11:38 AM  Result Value Ref Range   Color, Urine YELLOW YELLOW   APPearance CLEAR CLEAR   Specific Gravity, Urine 1.021 1.005 - 1.030   pH 6.0 5.0 - 8.0   Glucose, UA NEGATIVE NEGATIVE mg/dL   Hgb urine dipstick NEGATIVE NEGATIVE   Bilirubin Urine NEGATIVE NEGATIVE   Ketones, ur NEGATIVE NEGATIVE mg/dL   Protein, ur NEGATIVE NEGATIVE mg/dL   Nitrite NEGATIVE NEGATIVE   Leukocytes,Ua NEGATIVE NEGATIVE   RBC / HPF 0-5 0 - 5 RBC/hpf   WBC, UA 0-5 0 - 5 WBC/hpf   Bacteria, UA NONE SEEN NONE SEEN   Squamous Epithelial / LPF 0-5 0 - 5   Mucus PRESENT   Wet prep, genital     Status: Abnormal   Collection Time: 08/04/22 12:53 PM  Result Value Ref Range   Yeast Wet Prep HPF POC NONE SEEN NONE SEEN   Trich, Wet Prep NONE SEEN NONE SEEN   Clue Cells Wet Prep HPF POC NONE SEEN NONE SEEN   WBC, Wet Prep HPF POC >=10 (A) <10   Sperm NONE SEEN   CBC     Status: Abnormal   Collection Time: 08/04/22  1:34 PM  Result Value Ref Range   WBC 7.9 4.0 - 10.5 K/uL   RBC 4.10 3.87 - 5.11 MIL/uL   Hemoglobin 12.4 12.0 - 15.0 g/dL   HCT 44.8 (L) 18.5 - 63.1 %   MCV 87.6 80.0 - 100.0 fL   MCH 30.2 26.0 - 34.0 pg   MCHC 34.5 30.0 - 36.0 g/dL   RDW 49.7 02.6 - 37.8 %   Platelets 333 150 - 400 K/uL   nRBC 0.0 0.0 - 0.2 %  hCG, quantitative, pregnancy     Status: Abnormal   Collection Time: 08/04/22  1:34 PM  Result Value Ref Range   hCG, Beta Chain, Quant, S 51 (H) <5 mIU/mL  HIV Antibody (routine testing w rflx)     Status: None   Collection Time: 08/04/22  1:34 PM  Result Value Ref Range   HIV Screen 4th Generation wRfx Non Reactive Non Reactive    US OB LESS THAN 14 WEEKS WITH OB TRANSVAGINAL  Result Date: 08/04/2022 CLINICAL DATA:  Pelvic pain, back pain EXAM: OBSTETRIC <14 WK [place] AND TRANSVAGINAL OB US TECHNIQUE: Both transabdominal and transvaginal  ultrasound examinations were performed for complete evaluation of the gestation as well as the maternal uterus, adnexal regions, and  pelvic cul-de-sac. Transvaginal technique was performed to assess early pregnancy. COMPARISON:  None Available. FINDINGS: Intrauterine gestational sac: None Yolk sac:  Not seen Embryo:  Not seen Cardiac Activity: Not seen Subchorionic hemorrhage:  None visualized. Maternal uterus/adnexae: No significant abnormalities are seen in uterus and adnexal regions. Nabothian cysts are seen in cervix. Small amount of free fluid in pelvis may suggest recent repair of ovarian cyst or follicle. IMPRESSION: There is no demonstrable intrauterine gestational sac. If pregnancy test is positive, differential diagnostic possibilities would include very early IUP or failed gestation with complete avulsion or ectopic gestation. Serial HCG estimations and follow-up sonogram as warranted should be considered. Small amount of free fluid in cul-de-sac and pelvis may suggest recent rupture of ovarian follicle. Electronically Signed   By: Ernie Avena M.D.   On: 08/04/2022 14:31      Assessment and Plan  1. Pregnancy of unknown anatomic location - Discussed ectopic pregnancy precautions and when to return to MAU - Repeat HCG in 48 hours  2. Back pain affecting pregnancy in first trimester - Information provided on back pain in pregnancy   3. Pelvic pain affecting pregnancy in first trimester, antepartum - Information provided on abdominal pain in pregnancy  and threatened miscarriage  4. [redacted] weeks gestation of pregnancy   - Discharge patient - Scheduled to return to MAU on Monday 08/06/2022 at 4 pm for repeat HCG - patient unable to get off work before 2330 that day, so will come prior to going to work (before 12 pm) - Patient verbalized an understanding of the plan of care and agrees.   Raelyn Mora, CNM 08/04/2022, 12:36 PM

## 2022-08-04 NOTE — Discharge Instructions (Addendum)
Return to MAU: If you have heavy bleeding that soaks through more that 2 pads per hour for an hour or more If you bleed so much that you feel like you might pass out or you do pass out If you have significant abdominal pain that is not improved with Flexeril as prescribed you can take Tylenol 1000 mg every 8 hours as needed for pain in addition to the Flexeril If you develop a fever > 100.5

## 2022-08-04 NOTE — ED Notes (Signed)
Patient is being discharged from the Urgent Care and sent to the Emergency Department via POV w/ s/o . Per Dr Delton See, patient is in need of higher level of care due to positive pregnancy test and 10/10 pain. Patient is aware and verbalizes understanding of plan of care.  Vitals:   08/04/22 0838  BP: 128/83  Pulse: (!) 104  Resp: 16  Temp: 99.3 F (37.4 C)  SpO2: 97%   Report called to MAU  RN. Pt to [facility]

## 2022-08-04 NOTE — MAU Note (Addendum)
...  Catherine Patterson is a 31 y.o. at approximately [redacted]w[redacted]d here in MAU reporting: Left lower back pain and pelvic cramping that began one week ago. She reports she is no longer feeling her pelvic pain and only the left lower back pain. She reports her pain was so bad throughout the night she was unable to sleep. Denies vaginal itching and vaginal odors. Denies urinary frequency, strong smelling odors, and burning with urination. Denies vaginal bleeding.  She reports she last took 1500 mg of Tylenol last night around midnight. She reports she was taking 1500 mg of Tylenol twice a day.  LMP: 07/03/2022 Onset of complaint: One week ago  Pain score:  10/10 left lower back - pressure  Lab orders placed from triage:  UA

## 2022-08-04 NOTE — Discharge Instructions (Signed)
Go to [facility] hospital

## 2022-08-06 ENCOUNTER — Inpatient Hospital Stay (HOSPITAL_COMMUNITY): Admit: 2022-08-06 | Payer: Medicaid Other | Attending: Obstetrics and Gynecology | Admitting: Obstetrics and Gynecology

## 2022-08-07 ENCOUNTER — Inpatient Hospital Stay (HOSPITAL_COMMUNITY)
Admission: AD | Admit: 2022-08-07 | Discharge: 2022-08-07 | Disposition: A | Discharge status: Home / Self Care | Payer: Medicaid Other | Attending: Obstetrics & Gynecology | Admitting: Obstetrics & Gynecology

## 2022-08-07 ENCOUNTER — Other Ambulatory Visit: Payer: Self-pay | Admitting: Obstetrics and Gynecology

## 2022-08-07 DIAGNOSIS — O3680X0 Pregnancy with inconclusive fetal viability, not applicable or unspecified: Secondary | ICD-10-CM

## 2022-08-07 DIAGNOSIS — Z3A01 Less than 8 weeks gestation of pregnancy: Secondary | ICD-10-CM | POA: Insufficient documentation

## 2022-08-07 LAB — GC/CHLAMYDIA PROBE AMP (~~LOC~~) NOT AT ARMC
Chlamydia: NEGATIVE
Comment: NEGATIVE
Comment: NORMAL
Neisseria Gonorrhea: NEGATIVE

## 2022-08-07 LAB — HCG, QUANTITATIVE, PREGNANCY: hCG, Beta Chain, Quant, S: 128 m[IU]/mL — ABNORMAL HIGH (ref <5)

## 2022-08-07 NOTE — MAU Provider Note (Signed)
Subjective:  Catherine Patterson is a 31 y.o. G2P0101 at [redacted]w[redacted]d who presents today for FU BHCG. She was seen on 9/2. Results from that day show no IUP on [place], and HCG 51. She denies vaginal bleeding. She denies abdominal or pelvic pain.  Objective:  Physical Exam  Nursing note and vitals reviewed. Constitutional: She is oriented to person, place, and time. She appears well-developed and well-nourished. No distress.  HENT:  Head: Normocephalic.  Cardiovascular: Normal rate.  Respiratory: Effort normal.  GI: Soft. There is no tenderness.  Neurological: She is alert and oriented to person, place, and time. Skin: Skin is warm and dry.  Psychiatric: She has a normal mood and affect.   No results found for this or any previous visit (from the past 24 hour(s)).  Assessment/Plan: Pregnancy of unknown location HCG did  rise appropriately FU in 10-14 days for f/u US.   Venia Carbon I, NP. 08/07/2022 9:41 AM

## 2022-08-07 NOTE — MAU Note (Signed)
..  Catherine Patterson is a 31 y.o. at [redacted]w[redacted]d here in MAU reporting: here for HCG levels. Denies new pain or bleeding.   Pain score: 0/10 Vitals:   08/07/22 0056 08/07/22 0057  BP:  127/74  Pulse:  89  Resp:  13  Temp:  98 F (36.7 C)  SpO2: 99%       Lab orders placed from triage:  HCG

## 2022-08-20 ENCOUNTER — Other Ambulatory Visit: Payer: Self-pay | Admitting: Family Medicine

## 2022-08-20 DIAGNOSIS — O3680X0 Pregnancy with inconclusive fetal viability, not applicable or unspecified: Secondary | ICD-10-CM

## 2022-08-21 ENCOUNTER — Telehealth: Payer: Self-pay

## 2022-08-21 NOTE — Telephone Encounter (Signed)
-----   Message from Truett Mainland, DO sent at 08/20/2022  5:24 PM EDT ----- Regarding: [place] Patient was supposed to have [place] scheduled for viability, but it doesn't look like it was ever ordered. I have placed orders - can you please make sure it gets scheduled?? Thanks!

## 2022-08-21 NOTE — Telephone Encounter (Signed)
Called pt to confirm [place] appointment. No answer and no voicemail set up. Will send MyChart message and attempt to call again at a later time.

## 2022-08-23 ENCOUNTER — Other Ambulatory Visit: Payer: Self-pay | Admitting: Family Medicine

## 2022-08-23 ENCOUNTER — Ambulatory Visit
Admission: RE | Admit: 2022-08-23 | Discharge: 2022-08-23 | Disposition: A | Discharge status: Home / Self Care | Payer: Medicaid Other | Source: Ambulatory Visit | Attending: Family Medicine | Admitting: Family Medicine

## 2022-08-23 DIAGNOSIS — O3680X0 Pregnancy with inconclusive fetal viability, not applicable or unspecified: Secondary | ICD-10-CM

## 2022-09-26 DIAGNOSIS — Z113 Encounter for screening for infections with a predominantly sexual mode of transmission: Secondary | ICD-10-CM | POA: Diagnosis not present

## 2022-09-26 DIAGNOSIS — O0991 Supervision of high risk pregnancy, unspecified, first trimester: Secondary | ICD-10-CM | POA: Diagnosis not present

## 2022-09-26 DIAGNOSIS — O3680X0 Pregnancy with inconclusive fetal viability, not applicable or unspecified: Secondary | ICD-10-CM | POA: Diagnosis not present

## 2022-09-26 LAB — OB RESULTS CONSOLE VARICELLA ZOSTER ANTIBODY, IGG: Varicella: IMMUNE

## 2022-09-26 LAB — OB RESULTS CONSOLE HEPATITIS B SURFACE ANTIGEN: Hepatitis B Surface Ag: NEGATIVE

## 2022-09-26 LAB — OB RESULTS CONSOLE RUBELLA ANTIBODY, IGM: Rubella: IMMUNE

## 2022-10-10 DIAGNOSIS — O0992 Supervision of high risk pregnancy, unspecified, second trimester: Secondary | ICD-10-CM | POA: Diagnosis not present

## 2022-10-15 ENCOUNTER — Other Ambulatory Visit: Payer: Self-pay | Admitting: Certified Nurse Midwife

## 2022-10-15 DIAGNOSIS — Z3689 Encounter for other specified antenatal screening: Secondary | ICD-10-CM

## 2022-10-23 DIAGNOSIS — O3680X0 Pregnancy with inconclusive fetal viability, not applicable or unspecified: Secondary | ICD-10-CM | POA: Diagnosis not present

## 2022-10-23 DIAGNOSIS — O0991 Supervision of high risk pregnancy, unspecified, first trimester: Secondary | ICD-10-CM | POA: Diagnosis not present

## 2022-10-31 ENCOUNTER — Other Ambulatory Visit: Payer: Medicaid Other

## 2022-11-13 ENCOUNTER — Other Ambulatory Visit: Payer: Medicaid Other

## 2022-11-15 ENCOUNTER — Ambulatory Visit: Payer: Medicaid Other

## 2022-11-15 ENCOUNTER — Ambulatory Visit: Payer: Medicaid Other | Attending: Obstetrics | Admitting: Obstetrics

## 2022-11-15 ENCOUNTER — Ambulatory Visit (HOSPITAL_BASED_OUTPATIENT_CLINIC_OR_DEPARTMENT_OTHER): Payer: Medicaid Other

## 2022-11-15 ENCOUNTER — Other Ambulatory Visit: Payer: Self-pay | Admitting: Certified Nurse Midwife

## 2022-11-15 ENCOUNTER — Other Ambulatory Visit: Payer: Self-pay

## 2022-11-15 DIAGNOSIS — Z3A18 18 weeks gestation of pregnancy: Secondary | ICD-10-CM

## 2022-11-15 DIAGNOSIS — O09292 Supervision of pregnancy with other poor reproductive or obstetric history, second trimester: Secondary | ICD-10-CM | POA: Diagnosis not present

## 2022-11-15 DIAGNOSIS — O321XX0 Maternal care for breech presentation, not applicable or unspecified: Secondary | ICD-10-CM | POA: Insufficient documentation

## 2022-11-15 DIAGNOSIS — Z3A22 22 weeks gestation of pregnancy: Secondary | ICD-10-CM | POA: Diagnosis not present

## 2022-11-15 DIAGNOSIS — Z3689 Encounter for other specified antenatal screening: Secondary | ICD-10-CM

## 2022-11-15 DIAGNOSIS — O99212 Obesity complicating pregnancy, second trimester: Secondary | ICD-10-CM | POA: Diagnosis not present

## 2022-11-15 DIAGNOSIS — O09212 Supervision of pregnancy with history of pre-term labor, second trimester: Secondary | ICD-10-CM | POA: Diagnosis not present

## 2022-11-15 DIAGNOSIS — E669 Obesity, unspecified: Secondary | ICD-10-CM

## 2022-11-15 NOTE — Progress Notes (Signed)
MFM Note  Catherine Patterson was seen for a detailed fetal anatomy scan due to maternal obesity with a BMI of 48 and a prior previable delivery at 22+ weeks.    The patient reports that she ruptured membranes two days prior to her previable delivery at 22 weeks.  She denies any prior LEEP or cone biopsies.    Due to her history of a prior preterm birth, she has been using daily vaginal progesterone (Prometrium 200 mg) daily at bedtime since about 16 weeks.  She denies any significant past medical history and denies any problems in her current pregnancy.    She had a cell free DNA test earlier in her pregnancy which indicated a low risk for trisomy 27, 42, and 13. A female fetus is predicted.   She was informed that the fetal growth and amniotic fluid level were appropriate for her gestational age.   There were no obvious fetal anomalies noted on today's ultrasound exam.  However, the views of the fetal anatomy were limited today due to the fetal position.  The patient was informed that anomalies may be missed due to technical limitations. If the fetus is in a suboptimal position or maternal habitus is increased, visualization of the fetus in the maternal uterus may be impaired.  A transvaginal ultrasound performed today shows a cervical length of about 2.4 to 2.5 cm long.  There was some debris, which is often noted in women treated with vaginal progesterone, directly above the internal cervical os.  The following were discussed during today's consultation:  Prior previable birth due to PPROM  The patient was advised regarding the increased risk of another preterm birth in her current pregnancy due to her history of a prior preterm birth.  She was advised that her cervical length of 2.4 to 2.5 cm today is in the lower normal range.  She was advised to continue using the daily vaginal progesterone until 36 weeks to decrease her risk of another preterm birth.    Due to the  borderline cervical length and her history, she will return to our office for another transvaginal cervical length measurement in 2 weeks.  We will continue to follow her with serial cervical length measurements up until 24 weeks.  Should progressive cervical shortening be noted during her future ultrasound exams, a cervical cerclage may be indicated to reduce her risk of another preterm birth.  Obesity in pregnancy  The recommended total weight gain in pregnancy for obese women's between 10 to 20 pounds.  Due to obesity, she should be screened for gestational diabetes soon.    As her BMI is greater than 40, weekly fetal testing should be started at around 34 weeks.  As maternal obesity may present challenges associated with the management of anesthesia, an anesthesia consult should be obtained when she is admitted in labor.  As African-American women with a BMI of greater than 30 may be at increased risk for developing preeclampsia, she should continue taking a daily baby aspirin (81 mg daily) for preeclampsia prophylaxis.   She will return to our office in 2 weeks for another transvaginal cervical length measurement.    We will reassess the views of the fetal anatomy again during her future exams.    The patient stated that all of her questions were answered today.    A total of 45 minutes was spent counseling and coordinating the care for this patient.  Greater than 50% of the time was spent in direct face-to-face contact.

## 2022-11-20 DIAGNOSIS — E668 Other obesity: Secondary | ICD-10-CM | POA: Diagnosis not present

## 2022-11-20 DIAGNOSIS — O99212 Obesity complicating pregnancy, second trimester: Secondary | ICD-10-CM | POA: Diagnosis not present

## 2022-11-28 ENCOUNTER — Ambulatory Visit: Payer: Medicaid Other

## 2022-11-28 ENCOUNTER — Other Ambulatory Visit: Payer: Medicaid Other

## 2022-11-28 ENCOUNTER — Other Ambulatory Visit: Payer: Self-pay | Admitting: *Deleted

## 2022-11-28 ENCOUNTER — Ambulatory Visit: Payer: Medicaid Other | Attending: Neurology | Admitting: *Deleted

## 2022-11-28 ENCOUNTER — Other Ambulatory Visit: Payer: Self-pay | Admitting: Obstetrics and Gynecology

## 2022-11-28 ENCOUNTER — Ambulatory Visit (HOSPITAL_BASED_OUTPATIENT_CLINIC_OR_DEPARTMENT_OTHER): Payer: Medicaid Other

## 2022-11-28 VITALS — BP 122/66 | HR 108

## 2022-11-28 DIAGNOSIS — Z3A20 20 weeks gestation of pregnancy: Secondary | ICD-10-CM

## 2022-11-28 DIAGNOSIS — O09212 Supervision of pregnancy with history of pre-term labor, second trimester: Secondary | ICD-10-CM

## 2022-11-28 DIAGNOSIS — O09292 Supervision of pregnancy with other poor reproductive or obstetric history, second trimester: Secondary | ICD-10-CM

## 2022-11-28 DIAGNOSIS — O3680X0 Pregnancy with inconclusive fetal viability, not applicable or unspecified: Secondary | ICD-10-CM

## 2022-11-28 DIAGNOSIS — O3432 Maternal care for cervical incompetence, second trimester: Secondary | ICD-10-CM | POA: Diagnosis not present

## 2022-11-28 DIAGNOSIS — O99212 Obesity complicating pregnancy, second trimester: Secondary | ICD-10-CM

## 2022-11-28 DIAGNOSIS — E669 Obesity, unspecified: Secondary | ICD-10-CM

## 2022-11-28 DIAGNOSIS — O26879 Cervical shortening, unspecified trimester: Secondary | ICD-10-CM

## 2022-11-29 DIAGNOSIS — R051 Acute cough: Secondary | ICD-10-CM | POA: Diagnosis not present

## 2022-12-04 DIAGNOSIS — R7309 Other abnormal glucose: Secondary | ICD-10-CM | POA: Diagnosis not present

## 2022-12-06 ENCOUNTER — Ambulatory Visit: Payer: Medicaid Other | Attending: Maternal & Fetal Medicine

## 2022-12-06 ENCOUNTER — Ambulatory Visit: Payer: Medicaid Other | Admitting: *Deleted

## 2022-12-06 VITALS — BP 125/73 | HR 107

## 2022-12-06 DIAGNOSIS — O09292 Supervision of pregnancy with other poor reproductive or obstetric history, second trimester: Secondary | ICD-10-CM

## 2022-12-06 DIAGNOSIS — O26879 Cervical shortening, unspecified trimester: Secondary | ICD-10-CM | POA: Insufficient documentation

## 2022-12-06 DIAGNOSIS — E669 Obesity, unspecified: Secondary | ICD-10-CM | POA: Diagnosis not present

## 2022-12-06 DIAGNOSIS — O26872 Cervical shortening, second trimester: Secondary | ICD-10-CM | POA: Diagnosis not present

## 2022-12-06 DIAGNOSIS — O99212 Obesity complicating pregnancy, second trimester: Secondary | ICD-10-CM

## 2022-12-06 DIAGNOSIS — Z3A21 21 weeks gestation of pregnancy: Secondary | ICD-10-CM

## 2022-12-06 DIAGNOSIS — O3432 Maternal care for cervical incompetence, second trimester: Secondary | ICD-10-CM

## 2022-12-06 DIAGNOSIS — O09212 Supervision of pregnancy with history of pre-term labor, second trimester: Secondary | ICD-10-CM

## 2022-12-07 ENCOUNTER — Other Ambulatory Visit: Payer: Self-pay | Admitting: Obstetrics and Gynecology

## 2022-12-07 DIAGNOSIS — Z01818 Encounter for other preprocedural examination: Secondary | ICD-10-CM

## 2022-12-07 DIAGNOSIS — O09292 Supervision of pregnancy with other poor reproductive or obstetric history, second trimester: Secondary | ICD-10-CM | POA: Diagnosis not present

## 2022-12-07 DIAGNOSIS — O0992 Supervision of high risk pregnancy, unspecified, second trimester: Secondary | ICD-10-CM | POA: Diagnosis not present

## 2022-12-07 NOTE — H&P (Signed)
[name] is a 32 y.o. female here for Routine Prenatal Visit (Schedule cerclage) .EGA 21+4  on 12/07/22   Pt with a h/o a fetal loss at 22 weeks with last . Baby expired 5 min post svd She has been on vaginal progesterone 200 mg qhs  Serial cervical lengths : 09/26/22: 4.7 cm 10/23/22: 4.5 cm  11/28/22: 2.5 cm  12/06/22: at [facility] MFM : 2.4cm   No CTx . No leakage of fluid    Past Medical History:  has no past medical history on file.  Past Surgical History:  has no past surgical history on file. Family History: family history includes Diabetes in her maternal grandfather and maternal grandmother; High blood pressure (Hypertension) in her maternal grandfather and maternal grandmother; Myocardial Infarction (Heart attack) in her maternal grandmother. Social History:  reports that she has never smoked. She has never used smokeless tobacco. She reports that she does not currently use alcohol. She reports that she does not use drugs. OB/GYN History:  OB History       Gravida  2   Para  1   Term      Preterm  1   AB      Living  0        SAB      IAB      Ectopic      Molar      Multiple      Live Births  1             Allergies: has No Known Allergies. Medications:   Current Outpatient Medications:    aspirin 81 MG EC tablet, Take 81 mg by mouth once daily, Disp: , Rfl:    prenatal vit-iron fum-folic ac (PRENAVITE) tablet, Take 1 tablet by mouth once daily, Disp: , Rfl:    progesterone (PROMETRIUM) 200 MG capsule, Place one tablet vaginally every night at bedtime, Disp: 30 capsule, Rfl: 5   aspirin 81 MG EC tablet, Take 1 tablet (81 mg total) by mouth once daily (Patient not taking: Reported on 09/30/2020), Disp: 90 tablet, Rfl: 2   Review of Systems: General:                      No fatigue or weight loss Eyes:                           No vision changes Ears:                            No hearing difficulty Respiratory:                No cough or shortness of  breath Pulmonary:                  No asthma or shortness of breath Cardiovascular:           No chest pain, palpitations, dyspnea on exertion Gastrointestinal:          No abdominal bloating, chronic diarrhea, constipations, masses, pain or hematochezia Genitourinary:             No hematuria, dysuria, abnormal vaginal discharge, pelvic pain, Menometrorrhagia Lymphatic:                   No swollen lymph nodes Musculoskeletal:         No muscle weakness Neurologic:  No extremity weakness, syncope, seizure disorder Psychiatric:                  No history of depression, delusions or suicidal/homicidal ideation      Exam:       Vitals:    12/07/22 0956  BP: 138/84      Body mass index is 50.19 kg/m.   WDWN / black female in NAD   Lungs: CTA  CV : RRR without murmur       Impression:    The primary encounter diagnosis was H/O incompetent cervix, currently pregnant, second trimester. A diagnosis of Supervision of high risk pregnancy in first trimester was also pertinent to this visit. Given the clinical history I and Dr Gertie Exon believe that she most likely has a incompetent cervix Cx shortening as of late        Plan:    Spoke to her about the pros and cons of cerclage placement . Risks of rupturing membranes which could lead to fetal demise  She understands the risks and wishes to proceed with cerclage placement 12/10/22 Benefits and risks to surgery: The proposed benefit of the surgery has been discussed with the patient. The possible risks include, but are not limited to: organ injury to the bowel , bladder, ureters, and major blood vessels and nerves. There is a possibility of additional surgeries resulting from these injuries. There is also the risk of blood transfusion and the need to receive blood products during or after the procedure which may rarely lead to HIV or Hepatitis C infection. There is a risk of developing a deep venous thrombosis or a pulmonary  embolism . There is the possibility of wound infection and also anesthetic complications, even the rare possibility of death. The patient understands these risks and wishes to proceed. All questions have been answered and the consent has been signed.      No follow-ups on file.   Caroline Sauger, MD

## 2022-12-09 MED ORDER — CHLORHEXIDINE GLUCONATE 0.12 % MT SOLN: 15.0000 mL | Freq: Once | OROMUCOSAL | Status: AC

## 2022-12-09 MED ORDER — GABAPENTIN 300 MG PO CAPS: 300.0000 mg | ORAL_CAPSULE | ORAL | Status: AC

## 2022-12-09 MED ORDER — LACTATED RINGERS IV SOLN: INTRAVENOUS | Status: DC

## 2022-12-09 MED ORDER — CEFAZOLIN IN SODIUM CHLORIDE 3-0.9 GM/100ML-% IV SOLN
3.0000 g | Freq: Once | INTRAVENOUS | Status: AC
  Administered 2022-12-10: 3 g via INTRAVENOUS
  Filled 2022-12-09: qty 100

## 2022-12-09 MED ORDER — POVIDONE-IODINE 10 % EX SWAB: 2.0000 | Freq: Once | CUTANEOUS | Status: DC

## 2022-12-09 MED ORDER — ACETAMINOPHEN 500 MG PO TABS: 1000.0000 mg | ORAL_TABLET | ORAL | Status: AC

## 2022-12-09 MED ORDER — ORAL CARE MOUTH RINSE: 15.0000 mL | Freq: Once | OROMUCOSAL | Status: AC

## 2022-12-10 ENCOUNTER — Ambulatory Visit: Payer: Medicaid Other | Admitting: Certified Registered Nurse Anesthetist

## 2022-12-10 ENCOUNTER — Encounter
Admission: RE | Disposition: A | Discharge status: Home / Self Care | Payer: Self-pay | Source: Ambulatory Visit | Attending: Obstetrics and Gynecology

## 2022-12-10 ENCOUNTER — Encounter: Payer: Self-pay | Admitting: Obstetrics and Gynecology

## 2022-12-10 ENCOUNTER — Ambulatory Visit
Admission: RE | Admit: 2022-12-10 | Discharge: 2022-12-10 | Disposition: A | Discharge status: Home / Self Care | Payer: Medicaid Other | Source: Ambulatory Visit | Attending: Obstetrics and Gynecology | Admitting: Obstetrics and Gynecology

## 2022-12-10 ENCOUNTER — Other Ambulatory Visit: Payer: Self-pay

## 2022-12-10 DIAGNOSIS — Z01818 Encounter for other preprocedural examination: Secondary | ICD-10-CM

## 2022-12-10 DIAGNOSIS — O24419 Gestational diabetes mellitus in pregnancy, unspecified control: Secondary | ICD-10-CM | POA: Diagnosis not present

## 2022-12-10 DIAGNOSIS — Z87891 Personal history of nicotine dependence: Secondary | ICD-10-CM | POA: Diagnosis not present

## 2022-12-10 DIAGNOSIS — O3432 Maternal care for cervical incompetence, second trimester: Secondary | ICD-10-CM | POA: Diagnosis not present

## 2022-12-10 DIAGNOSIS — Z419 Encounter for procedure for purposes other than remedying health state, unspecified: Secondary | ICD-10-CM

## 2022-12-10 DIAGNOSIS — Z6841 Body Mass Index (BMI) 40.0 and over, adult: Secondary | ICD-10-CM | POA: Diagnosis not present

## 2022-12-10 DIAGNOSIS — Z3A22 22 weeks gestation of pregnancy: Secondary | ICD-10-CM | POA: Insufficient documentation

## 2022-12-10 DIAGNOSIS — O09292 Supervision of pregnancy with other poor reproductive or obstetric history, second trimester: Secondary | ICD-10-CM | POA: Insufficient documentation

## 2022-12-10 HISTORY — PX: CERVICAL CERCLAGE: SHX1329

## 2022-12-10 LAB — TYPE AND SCREEN
ABO/RH(D): A POS
Antibody Screen: NEGATIVE

## 2022-12-10 LAB — CBC
HCT: 33.2 % — ABNORMAL LOW (ref 36.0–46.0)
Hemoglobin: 11.6 g/dL — ABNORMAL LOW (ref 12.0–15.0)
MCH: 30.4 pg (ref 26.0–34.0)
MCHC: 34.9 g/dL (ref 30.0–36.0)
MCV: 87.1 fL (ref 80.0–100.0)
Platelets: 299 10*3/uL (ref 150–400)
RBC: 3.81 MIL/uL — ABNORMAL LOW (ref 3.87–5.11)
RDW: 12.1 % (ref 11.5–15.5)
WBC: 9.9 10*3/uL (ref 4.0–10.5)
nRBC: 0 % (ref 0.0–0.2)

## 2022-12-10 LAB — BASIC METABOLIC PANEL
Anion gap: 10 (ref 5–15)
BUN: <5 mg/dL — ABNORMAL LOW (ref 6–20)
CO2: 20 mmol/L — ABNORMAL LOW (ref 22–32)
Calcium: 8.6 mg/dL — ABNORMAL LOW (ref 8.9–10.3)
Chloride: 105 mmol/L (ref 98–111)
Creatinine, Ser: 0.59 mg/dL (ref 0.44–1.00)
GFR, Estimated: >60 mL/min (ref >60)
Glucose, Bld: 98 mg/dL (ref 70–99)
Potassium: 3.4 mmol/L — ABNORMAL LOW (ref 3.5–5.1)
Sodium: 135 mmol/L (ref 135–145)

## 2022-12-10 SURGERY — CERCLAGE, CERVIX, VAGINAL APPROACH
Anesthesia: Spinal | Site: Cervix

## 2022-12-10 MED ORDER — CHLORHEXIDINE GLUCONATE 0.12 % MT SOLN
OROMUCOSAL | Status: AC
  Administered 2022-12-10: 15 mL via OROMUCOSAL
  Filled 2022-12-10: qty 15

## 2022-12-10 MED ORDER — FENTANYL CITRATE (PF) 100 MCG/2ML IJ SOLN: 25.0000 ug | INTRAMUSCULAR | Status: DC | PRN

## 2022-12-10 MED ORDER — BUPIVACAINE IN DEXTROSE 0.75-8.25 % IT SOLN
INTRATHECAL | Status: DC | PRN
  Administered 2022-12-10: 1.3 mL via INTRATHECAL

## 2022-12-10 MED ORDER — SILVER NITRATE-POT NITRATE 75-25 % EX MISC
CUTANEOUS | Status: AC
  Filled 2022-12-10: qty 10

## 2022-12-10 MED ORDER — SEVOFLURANE IN SOLN
RESPIRATORY_TRACT | Status: AC
  Filled 2022-12-10: qty 250

## 2022-12-10 MED ORDER — GABAPENTIN 300 MG PO CAPS
ORAL_CAPSULE | ORAL | Status: AC
  Administered 2022-12-10: 300 mg via ORAL
  Filled 2022-12-10: qty 1

## 2022-12-10 MED ORDER — ACETAMINOPHEN 500 MG PO TABS
ORAL_TABLET | ORAL | Status: AC
  Administered 2022-12-10: 1000 mg via ORAL
  Filled 2022-12-10: qty 2

## 2022-12-10 SURGICAL SUPPLY — 24 items
CATH ROBINSON RED A/P 16FR (CATHETERS) ×1 IMPLANT
DRAPE PERI LITHO V/GYN (MISCELLANEOUS) IMPLANT
DRAPE UNDER BUTTOCK W/FLU (DRAPES) ×1 IMPLANT
ELECT REM PT RETURN 9FT ADLT (ELECTROSURGICAL) ×1
ELECTRODE REM PT RTRN 9FT ADLT (ELECTROSURGICAL) ×1 IMPLANT
GAUZE 4X4 16PLY ~~LOC~~+RFID DBL (SPONGE) ×1 IMPLANT
GLOVE SURG SYN 8.0 (GLOVE) ×1 IMPLANT
GLOVE SURG SYN 8.0 PF PI (GLOVE) ×1 IMPLANT
GOWN STRL REUS W/ TWL LRG LVL3 (GOWN DISPOSABLE) ×1 IMPLANT
GOWN STRL REUS W/ TWL XL LVL3 (GOWN DISPOSABLE) ×1 IMPLANT
GOWN STRL REUS W/TWL LRG LVL3 (GOWN DISPOSABLE) ×1
GOWN STRL REUS W/TWL XL LVL3 (GOWN DISPOSABLE) ×1
KIT TURNOVER CYSTO (KITS) ×1 IMPLANT
LABEL OR SOLS (LABEL) ×1 IMPLANT
MANIFOLD NEPTUNE II (INSTRUMENTS) ×1 IMPLANT
NS IRRIG 500ML POUR BTL (IV SOLUTION) ×1 IMPLANT
PACK BASIN MINOR ARMC (MISCELLANEOUS) ×1 IMPLANT
PAD OB MATERNITY 4.3X12.25 (PERSONAL CARE ITEMS) ×1 IMPLANT
PAD PREP 24X41 OB/GYN DISP (PERSONAL CARE ITEMS) ×1 IMPLANT
SCRUB CHG 4% DYNA-HEX 4OZ (MISCELLANEOUS) ×1 IMPLANT
SURGILUBE 2OZ TUBE FLIPTOP (MISCELLANEOUS) ×1 IMPLANT
TAPE MERSILENE 5MM 36 OS-8 WHT (SUTURE) ×1 IMPLANT
TRAP FLUID SMOKE EVACUATOR (MISCELLANEOUS) ×1 IMPLANT
WATER STERILE IRR 500ML POUR (IV SOLUTION) ×1 IMPLANT

## 2022-12-10 NOTE — Op Note (Unsigned)
Catherine Patterson, DOSTER MEDICAL RECORD NO: [id number] ACCOUNT NO: [id number] DATE OF BIRTH: 1991/06/16 FACILITY: [facility] LOCATION: [facility] PHYSICIAN: Boykin Nearing, MD  Operative Report   DATE OF PROCEDURE: 12/10/2022  PREOPERATIVE DIAGNOSES: 1.  A 22 plus 0 weeks estimated gestational age. 2.  Prior history of an incompetent cervix.  POSTOPERATIVE DIAGNOSES: 1.  A 22 plus 0 weeks estimated gestational age. 2.  Prior history of an incompetent cervix.  PROCEDURE:  Placement of McDonald cervical cerclage.  ANESTHESIA:  Spinal.  SURGEON:  Boykin Nearing, MD  FIRST ASSISTANT:  Prentice Docker, MD  INDICATIONS:  This is a 32 year old gravida 2, para 1 patient with a prior history of a 21 plus 4 weeks fetal loss with a history consistent of an incompetent cervix.  DESCRIPTION OF PROCEDURE:  After adequate spinal anesthesia, the patient was placed in dorsal supine position with legs in the Alpine stirrups.  The patient's abdomen, perineum and vagina were prepped and draped in normal sterile fashion.  Timeout was  performed.  The patient did receive 3 grams of IV Ancef prior to commencement for surgical prophylaxis.  A 5 mm Mersilene band was brought to the operative field. The patient's bladder was catheterized, yielding 20 mL dark urine.  A weighted speculum was  placed in the posterior vaginal vault.  A sidewall retractor and Deaver retractor were used for visualization of the cervix.  A McDonald cerclage was placed starting at 12 o'clock, exiting at 2 o'clock; entering at 4 o'clock, exiting at 6 o'clock;  entering at 7 o'clock, exiting at 8 o'clock; entering at 10 o'clock and exiting at 12 o'clock in a cloverleaf pattern with avoidance of the 3 and 9 o'clock vessels.  The cerclage was then tightened and multiple knots placed at 12 o'clock.  Good  hemostasis was noted.  No leakage of fluid was noted.  The patient tolerated the procedure well.  The patient was taken  to recovery room in good condition.  INTRAOPERATIVE FLUIDS:  200 mL.  URINE OUTPUT:  20 mL.  ESTIMATED BLOOD LOSS:  10 mL.   [facility] D: 12/10/2022 8:59:26 am T: 12/10/2022 9:36:00 am  JOB: [id number]/ [id number]

## 2022-12-10 NOTE — Brief Op Note (Signed)
12/10/2022  8:36 AM  PATIENT:  Catherine Patterson  32 y.o. female  PRE-OPERATIVE DIAGNOSIS:  incompetent cervix [redacted]w[redacted]d  POST-OPERATIVE DIAGNOSIS:  incompetent cervix [redacted]w[redacted]d  PROCEDURE:  Procedure(s): CERCLAGE CERVICAL (N/A) McDonald  SURGEON:  Surgeon(s) and Role:    * Tyffany Waldrop, Gwen Her, MD - Primary    * Will Bonnet, MD - Assisting  PHYSICIAN ASSISTANT:   ASSISTANTS: none   ANESTHESIA:   spinal  EBL:  10 mL OU 20 cc IOF 200 cc   BLOOD ADMINISTERED:none  DRAINS: none   LOCAL MEDICATIONS USED:  NONE  SPECIMEN:  No Specimen  DISPOSITION OF SPECIMEN:  N/A  COUNTS:  YES  TOURNIQUET:  * No tourniquets in log *  DICTATION: .Other Dictation: Dictation Number verbal   PLAN OF CARE: Discharge to home after PACU  PATIENT DISPOSITION:  PACU - hemodynamically stable.   Delay start of Pharmacological VTE agent (>24hrs) due to surgical blood loss or risk of bleeding: not applicable  FHT at end of case 141

## 2022-12-10 NOTE — Progress Notes (Signed)
Dr. Ouida Sills documented fetal heart tones to be 141 BPM at the end of the case.

## 2022-12-10 NOTE — Discharge Instructions (Signed)

## 2022-12-10 NOTE — Anesthesia Postprocedure Evaluation (Signed)
Anesthesia Post Note  Patient: Copywriter, advertising  Procedure(s) Performed: CERCLAGE CERVICAL (Cervix)  Patient location during evaluation: PACU Anesthesia Type: Spinal Level of consciousness: oriented and awake and alert Pain management: pain level controlled Vital Signs Assessment: post-procedure vital signs reviewed and stable Respiratory status: spontaneous breathing, respiratory function stable and patient connected to nasal cannula oxygen Cardiovascular status: blood pressure returned to baseline and stable Postop Assessment: no headache, no backache and no apparent nausea or vomiting Anesthetic complications: no   No notable events documented.   Last Vitals:  Vitals:   12/10/22 0930 12/10/22 0943  BP: 122/89 (!) 151/83  Pulse: 89 (!) 103  Resp: 14 20  Temp: 37.2 C 36.8 C  SpO2: 97% 98%    Last Pain:  Vitals:   12/10/22 0943  TempSrc: Temporal  PainSc: 0-No pain                 Arita Miss

## 2022-12-10 NOTE — Progress Notes (Signed)
Patient unable to stand and support her weight, states legs are "wobbly" at this time.

## 2022-12-10 NOTE — Progress Notes (Signed)
Here for cervical cerclage . Labs reviewed  FHT 140   All questions answered  Proceed

## 2022-12-10 NOTE — Anesthesia Preprocedure Evaluation (Signed)
Anesthesia Evaluation  Patient identified by MRN, date of birth, ID band Patient awake    Reviewed: Allergy & Precautions, H&P , NPO status , Patient's Chart, lab work & pertinent test results, reviewed documented beta blocker date and time   History of Anesthesia Complications Negative for: history of anesthetic complications  Airway Mallampati: IV  TM Distance: >3 FB Neck ROM: full    Dental  (+) Dental Advidsory Given, Teeth Intact, Chipped   Pulmonary neg pulmonary ROS, former smoker   Pulmonary exam normal breath sounds clear to auscultation       Cardiovascular Exercise Tolerance: Good negative cardio ROS Normal cardiovascular exam Rhythm:regular Rate:Normal     Neuro/Psych  PSYCHIATRIC DISORDERS  Depression    negative neurological ROS     GI/Hepatic Neg liver ROS,GERD  ,,  Endo/Other  diabetes, Gestational  Morbid obesity  Renal/GU negative Renal ROS  negative genitourinary   Musculoskeletal   Abdominal   Peds  Hematology negative hematology ROS (+)   Anesthesia Other Findings Past Medical History: No date: ADHD (attention deficit hyperactivity disorder) No date: Depression     Comment:  suicide attempt  in 2009 No date: Headache(784.0) 02/04/2012: Impingement syndrome of shoulder No date: Menorrhagia 06/29/2010: MENORRHAGIA     Comment:  Qualifier: Diagnosis of  By: Deborra Medina MD, Talia   No date: Sciatica   Reproductive/Obstetrics (+) Pregnancy                             Anesthesia Physical Anesthesia Plan  ASA: 3  Anesthesia Plan: Spinal   Post-op Pain Management:    Induction:   PONV Risk Score and Plan:   Airway Management Planned: Natural Airway and Nasal Cannula  Additional Equipment:   Intra-op Plan:   Post-operative Plan:   Informed Consent: I have reviewed the patients History and Physical, chart, labs and discussed the procedure including the risks,  benefits and alternatives for the proposed anesthesia with the patient or authorized representative who has indicated his/her understanding and acceptance.     Dental Advisory Given  Plan Discussed with: Anesthesiologist, CRNA and Surgeon  Anesthesia Plan Comments:        Anesthesia Quick Evaluation

## 2022-12-10 NOTE — OR Nursing (Signed)
Dr. Ouida Sills obtained Fetal Heart Tones 141 in OR prior to leaving OR to PACU.

## 2022-12-10 NOTE — Transfer of Care (Signed)
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  Last Pain:  Vitals:   12/10/22 0642  TempSrc: Temporal  PainSc: 0-No pain         Complications: No notable events documented.

## 2022-12-10 NOTE — Progress Notes (Signed)
FHT 140 at 2542 noted with doppler.

## 2022-12-11 ENCOUNTER — Other Ambulatory Visit: Payer: Self-pay

## 2022-12-11 DIAGNOSIS — O42112 Preterm premature rupture of membranes, onset of labor more than 24 hours following rupture, second trimester: Secondary | ICD-10-CM

## 2022-12-11 DIAGNOSIS — O09292 Supervision of pregnancy with other poor reproductive or obstetric history, second trimester: Secondary | ICD-10-CM

## 2022-12-11 DIAGNOSIS — O3432 Maternal care for cervical incompetence, second trimester: Secondary | ICD-10-CM

## 2022-12-11 DIAGNOSIS — O99212 Obesity complicating pregnancy, second trimester: Secondary | ICD-10-CM

## 2022-12-13 ENCOUNTER — Ambulatory Visit: Payer: Medicaid Other | Attending: Obstetrics

## 2022-12-13 ENCOUNTER — Other Ambulatory Visit: Payer: Self-pay

## 2022-12-13 DIAGNOSIS — O3432 Maternal care for cervical incompetence, second trimester: Secondary | ICD-10-CM | POA: Diagnosis not present

## 2022-12-13 DIAGNOSIS — O24410 Gestational diabetes mellitus in pregnancy, diet controlled: Secondary | ICD-10-CM

## 2022-12-13 DIAGNOSIS — O24419 Gestational diabetes mellitus in pregnancy, unspecified control: Secondary | ICD-10-CM | POA: Diagnosis not present

## 2022-12-13 DIAGNOSIS — O09292 Supervision of pregnancy with other poor reproductive or obstetric history, second trimester: Secondary | ICD-10-CM

## 2022-12-13 DIAGNOSIS — E669 Obesity, unspecified: Secondary | ICD-10-CM | POA: Diagnosis not present

## 2022-12-13 DIAGNOSIS — Z3A22 22 weeks gestation of pregnancy: Secondary | ICD-10-CM

## 2022-12-13 DIAGNOSIS — O09212 Supervision of pregnancy with history of pre-term labor, second trimester: Secondary | ICD-10-CM

## 2022-12-13 DIAGNOSIS — O99212 Obesity complicating pregnancy, second trimester: Secondary | ICD-10-CM | POA: Diagnosis not present

## 2022-12-14 DIAGNOSIS — O3432 Maternal care for cervical incompetence, second trimester: Secondary | ICD-10-CM | POA: Diagnosis not present

## 2022-12-18 DIAGNOSIS — O24410 Gestational diabetes mellitus in pregnancy, diet controlled: Secondary | ICD-10-CM | POA: Insufficient documentation

## 2022-12-24 ENCOUNTER — Encounter: Payer: Medicaid Other | Attending: Obstetrics | Admitting: *Deleted

## 2022-12-24 ENCOUNTER — Encounter: Payer: Self-pay | Admitting: *Deleted

## 2022-12-24 VITALS — Ht 68.0 in | Wt 333.2 lb

## 2022-12-24 DIAGNOSIS — O24415 Gestational diabetes mellitus in pregnancy, controlled by oral hypoglycemic drugs: Secondary | ICD-10-CM | POA: Insufficient documentation

## 2022-12-24 DIAGNOSIS — O24410 Gestational diabetes mellitus in pregnancy, diet controlled: Secondary | ICD-10-CM

## 2022-12-24 DIAGNOSIS — Z3A23 23 weeks gestation of pregnancy: Secondary | ICD-10-CM | POA: Insufficient documentation

## 2022-12-24 NOTE — Progress Notes (Signed)
Diabetes Self-Management Education  Visit Type: First/Initial  Appt. Start Time: 1020 Appt. End Time: 4315  12/24/2022  [name], identified by name and date of birth, is a 32 y.o. female with a diagnosis of Diabetes: Gestational Diabetes.   ASSESSMENT  Height 5\' 8"  (1.727 m), weight (!) 333 lb 3.2 oz (151.1 kg), last menstrual period 07/09/2022, estimated date of delivery 04/15/2023 Body mass index is 50.66 kg/m.   Diabetes Self-Management Education - 12/24/22 1224       Visit Information   Visit Type First/Initial      Initial Visit   Diabetes Type Gestational Diabetes    Date Diagnosed less than 1 month    Are you currently following a meal plan? No    Are you taking your medications as prescribed? Yes      Health Coping   How would you rate your overall health? Fair      Psychosocial Assessment   Patient Belief/Attitude about Diabetes Other (comment)   "indifferent   What is the hardest part about your diabetes right now, causing you the most concern, or is the most worrisome to you about your diabetes?   Making healty food and beverage choices    Self-care barriers None    Self-management support Doctor's office    Patient Concerns Nutrition/Meal planning    Special Needs None    Preferred Learning Style Hands on    Learning Readiness Change in progress    How often do you need to have someone help you when you read instructions, pamphlets, or other written materials from your doctor or pharmacy? 1 - Never    What is the last grade level you completed in school? some college      Pre-Education Assessment   Patient understands the diabetes disease and treatment process. Needs Instruction    Patient understands incorporating nutritional management into lifestyle. Needs Instruction    Patient undertands incorporating physical activity into lifestyle. Needs Instruction    Patient understands using medications safely. Needs Instruction    Patient  understands monitoring blood glucose, interpreting and using results Needs Instruction    Patient understands prevention, detection, and treatment of acute complications. Needs Instruction    Patient understands prevention, detection, and treatment of chronic complications. Needs Instruction    Patient understands how to develop strategies to address psychosocial issues. Needs Instruction    Patient understands how to develop strategies to promote health/change behavior. Needs Instruction      Complications   Last HgB A1C per patient/outside source 5.3 %   09/26/2022   How often do you check your blood sugar? 0 times/day (not testing)   Provided Accu-Chek Guide Me meter and instructed on use. BG upon return demonstration was 107 mg/dL at 11:50 am - 2 hrs pp.   Have you had a dilated eye exam in the past 12 months? No    Have you had a dental exam in the past 12 months? No    Are you checking your feet? No      Dietary Intake   Breakfast eggs, bacon, toast    Snack (morning) olives, sour candy, fruit (orange, bananas, apples)    Lunch cobb salads, soup    Snack (afternoon) same as am    Dinner fish, [place], chicken; potatoes, corn, broccoli, cauliflower, occasional green beans, pasta, rice, salads with lettuce, tomatoes, onions, olives    Snack (evening) same as am    Beverage(s) mostly water, sometimes juice  Activity / Exercise   Activity / Exercise Type ADL's      Patient Education   Previous Diabetes Education No    Disease Pathophysiology Definition of diabetes, type 1 and 2, and the diagnosis of diabetes;Factors that contribute to the development of diabetes    Healthy Eating Role of diet in the treatment of diabetes and the relationship between the three main macronutrients and blood glucose level;Food label reading, portion sizes and measuring food.;Reviewed blood glucose goals for pre and post meals and how to evaluate the patients' food intake on their blood glucose level.     Being Active Role of exercise on diabetes management, blood pressure control and cardiac health.    Medications Other (comment)   Limited use of oral medications during pregnancy and potential for insulin.   Monitoring Taught/evaluated SMBG meter.;Purpose and frequency of SMBG.;Taught/discussed recording of test results and interpretation of SMBG.;Identified appropriate SMBG and/or A1C goals.;Ketone testing, when, how.    Chronic complications Relationship between chronic complications and blood glucose control    Diabetes Stress and Support Identified and addressed patients feelings and concerns about diabetes;Role of stress on diabetes    Preconception care Pregnancy and GDM  Role of pre-pregnancy blood glucose control on the development of the fetus;Reviewed with patient blood glucose goals with pregnancy;Role of family planning for patients with diabetes      Individualized Goals (developed by patient)   Reducing Risk Other (comment)   changing diet     Outcomes   Expected Outcomes Demonstrated interest in learning. Expect positive outcomes    Future DMSE 2 wks         Individualized Plan for Diabetes Self-Management Training:   Learning Objective:  Patient will have a greater understanding of diabetes self-management. Patient education plan is to attend individual and/or group sessions per assessed needs and concerns.   Plan:  Read booklet on Gestational Diabetes Follow Gestational Meal Planning Guidelines Include 1 protein and 1 carbohydrate serving for a snack Allow 2-3 hours between meals and snacks Complete a 3 Day Food Record and bring to next appointment Check blood sugars 4 x day - before breakfast and 2 hrs after every meal and record  Bring blood sugar log to all appointments Call MD for prescription for meter strips and lancets Strips   Accu-Chek Guide   Lancets   Accu-Chek Softclix Purchase urine ketone strips if instructed by MD and check urine ketones every am:   If + increase bedtime snack to 1 protein and 2 carbohydrate servings Walk 20-30 minutes at least 5 x week if permitted by MD  Expected Outcomes:  Demonstrated interest in learning. Expect positive outcomes  Education material provided:  Gestational Booklet Gestational Meal Planning Guidelines Simple Meal Plan Meter = Accu-Chek Guide Me 3 Day Food Record Goals for a Healthy Pregnancy  If problems or questions, patient to contact team via:   Johny Drilling, RN, [place] [phone]  Future DSME appointment: 2 wks January 09, 2023 with the dietitian

## 2022-12-24 NOTE — Patient Instructions (Addendum)
Read booklet on Gestational Diabetes Follow Gestational Meal Planning Guidelines Include 1 protein and 1 carbohydrate serving for a snack Allow 2-3 hours between meals and snacks Complete a 3 Day Food Record and bring to next appointment Check blood sugars 4 x day - before breakfast and 2 hrs after every meal and record  Bring blood sugar log to all appointments Call MD for prescription for meter strips and lancets Strips   Accu-Chek Guide   Lancets   Accu-Chek Softclix Purchase urine ketone strips if instructed by MD and check urine ketones every am:  If + increase bedtime snack to 1 protein and 2 carbohydrate servings Walk 20-30 minutes at least 5 x week if permitted by MD

## 2023-01-07 ENCOUNTER — Other Ambulatory Visit: Payer: Self-pay

## 2023-01-07 DIAGNOSIS — O42112 Preterm premature rupture of membranes, onset of labor more than 24 hours following rupture, second trimester: Secondary | ICD-10-CM

## 2023-01-07 DIAGNOSIS — O99212 Obesity complicating pregnancy, second trimester: Secondary | ICD-10-CM

## 2023-01-07 DIAGNOSIS — O09292 Supervision of pregnancy with other poor reproductive or obstetric history, second trimester: Secondary | ICD-10-CM

## 2023-01-07 DIAGNOSIS — O3432 Maternal care for cervical incompetence, second trimester: Secondary | ICD-10-CM

## 2023-01-08 ENCOUNTER — Ambulatory Visit: Payer: Medicaid Other | Attending: Maternal & Fetal Medicine

## 2023-01-08 ENCOUNTER — Other Ambulatory Visit: Payer: Self-pay

## 2023-01-08 DIAGNOSIS — O09292 Supervision of pregnancy with other poor reproductive or obstetric history, second trimester: Secondary | ICD-10-CM | POA: Insufficient documentation

## 2023-01-08 DIAGNOSIS — O09212 Supervision of pregnancy with history of pre-term labor, second trimester: Secondary | ICD-10-CM

## 2023-01-08 DIAGNOSIS — E669 Obesity, unspecified: Secondary | ICD-10-CM

## 2023-01-08 DIAGNOSIS — Z3A26 26 weeks gestation of pregnancy: Secondary | ICD-10-CM | POA: Diagnosis not present

## 2023-01-08 DIAGNOSIS — O24410 Gestational diabetes mellitus in pregnancy, diet controlled: Secondary | ICD-10-CM | POA: Diagnosis not present

## 2023-01-08 DIAGNOSIS — O3432 Maternal care for cervical incompetence, second trimester: Secondary | ICD-10-CM | POA: Diagnosis not present

## 2023-01-08 DIAGNOSIS — O99212 Obesity complicating pregnancy, second trimester: Secondary | ICD-10-CM | POA: Insufficient documentation

## 2023-01-08 DIAGNOSIS — O42112 Preterm premature rupture of membranes, onset of labor more than 24 hours following rupture, second trimester: Secondary | ICD-10-CM

## 2023-01-09 ENCOUNTER — Encounter: Payer: Self-pay | Admitting: Dietician

## 2023-01-09 ENCOUNTER — Encounter: Payer: Medicaid Other | Attending: Obstetrics | Admitting: Dietician

## 2023-01-09 VITALS — BP 120/82 | Ht 68.0 in | Wt 338.3 lb

## 2023-01-09 DIAGNOSIS — O24410 Gestational diabetes mellitus in pregnancy, diet controlled: Secondary | ICD-10-CM | POA: Diagnosis present

## 2023-01-09 NOTE — Progress Notes (Signed)
Patient's BG record indicates fasting BGs ranging 81-97, and post-meal BGs ranging 80-126. She denies any episodes of hypoglycemia. Patient's food diary indicates she is generally eating at regular intervals but does occasionally miss a meal when busy working. She is including protein sources with meals, controlling carb intake, and limiting/ avoiding sweets and sweetened beverages. Provided basic balanced meal plan, and wrote individualized menus based on patient's food preferences. Discussed simple light meal and snack options for busy days. Instructed patient on food safety, including avoidance of Listeriosis, and limiting mercury from fish. Discussed importance of maintaining healthy lifestyle habits to reduce risk of Type 2 DM as well as Gestational DM with any future pregnancies. Advised patient to use any remaining testing supplies to test some BGs after delivery, and to have BG tested ideally annually, as well as prior to attempting future pregnancies.

## 2023-01-09 NOTE — Patient Instructions (Signed)
Good job eating balanced meals, keep it up! Plan to eat a meal or snack every 3-5 ours during the day. Eating a bedtime snack with protein + 15-30g of fruit or whole grain might help prevent nighttime hunger.

## 2023-01-23 ENCOUNTER — Other Ambulatory Visit: Payer: Self-pay

## 2023-01-23 ENCOUNTER — Ambulatory Visit: Payer: Medicaid Other | Admitting: Physical Therapy

## 2023-01-23 ENCOUNTER — Encounter: Payer: Self-pay | Admitting: Physical Therapy

## 2023-01-23 DIAGNOSIS — M5416 Radiculopathy, lumbar region: Secondary | ICD-10-CM | POA: Diagnosis present

## 2023-01-23 DIAGNOSIS — M6281 Muscle weakness (generalized): Secondary | ICD-10-CM | POA: Diagnosis present

## 2023-01-23 NOTE — Therapy (Addendum)
OUTPATIENT PHYSICAL THERAPY THORACOLUMBAR EVALUATION   Patient Name: Catherine Patterson MRN: [id number] DOB:Oct 21, 1991, 32 y.o., female Today's Date: 01/23/2023  END OF SESSION:  PT End of Session - 01/23/23 0958     Visit Number 1    Date for PT Re-Evaluation 03/20/23    Authorization Type [employer] - requesting 2x/week x 8 weeks    PT Start Time 0852    PT Stop Time 0932    PT Time Calculation (min) 40 min    Activity Tolerance Patient limited by pain;Other (comment)   dizziness limited evaluation   Behavior During Therapy [facility] for tasks assessed/performed;Anxious             Past Medical History:  Diagnosis Date   ADHD (attention deficit hyperactivity disorder)    Depression    suicide attempt  in 2009   Gestational diabetes    Headache(784.0)    Impingement syndrome of shoulder 02/04/2012   Menorrhagia    MENORRHAGIA 06/29/2010   Qualifier: Diagnosis of  By: Deborra Medina MD, Talia     Sciatica    Past Surgical History:  Procedure Laterality Date   CERVICAL CERCLAGE N/A 12/10/2022   Procedure: CERCLAGE CERVICAL;  Surgeon: Boykin Nearing, MD;  Location: [facility] ORS;  Service: Gynecology;  Laterality: N/A;   NO PAST SURGERIES     Patient Active Problem List   Diagnosis Date Noted   Preterm labor in second trimester with preterm delivery 09/19/2020   Preterm premature rupture of membranes (PPROM) with onset of labor after 24 hours of rupture in second trimester, antepartum 09/18/2020   Vaginal bleeding in pregnancy, second trimester 09/08/2020   Supervision of normal pregnancy 07/19/2020   Obesity affecting pregnancy in second trimester 07/18/2020   DEPRESSION 06/29/2010   ADHD 06/29/2010    PCP: no PCP  REFERRING PROVIDER: Minda Meo, CNM  REFERRING DIAG: M54.31 (ICD-10-CM) - Sciatica, right side  Rationale for Evaluation and Treatment: Rehabilitation  THERAPY DIAG:  Radiculopathy, lumbar region  Muscle weakness (generalized)  ONSET DATE:  beginning of this pregnancy  SUBJECTIVE:                                                                                                                                                                                           SUBJECTIVE STATEMENT: Pt is 48w2dpregnant referred to OPPT with Rt sided sciatica. This is patient's second pregnancy (had a loss of the baby after delivery - did have pain in Rt LE with that pregnancy as well).  LBP comes on with 10 min of sitting or laying on either side within min.  I am not sleeping well  at all given pain when I lay down.  I have about 7 pillows in my bed I've been trying to use.  I have  small physioball and have been sitting on that doing some stretches which helps a little bit. 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 Shooting pain from Rt lumbar to buttock, sometimes into Rt thigh and leg and leg can give out on me.   I have started to feel dizzy on and off the last few days.  PERTINENT HISTORY:  Pregnant with EDD 04/15/23  PAIN:  PAIN:  Are you having pain? Yes NPRS scale: 7-10/10 Pain location: Rt lumbar and LE Pain orientation: Right  PAIN TYPE: sharp and shooting Pain description: constant  Aggravating factors: sitting >10 min or laying on either side for 10 min Relieving factors: change positions, sitting and stretching on physioball   PRECAUTIONS: Other: pregnant, EDD 04/15/23  WEIGHT BEARING RESTRICTIONS: No  FALLS:  Has patient fallen in last 6 months? No but near falls especially at night going to bathroom  LIVING ENVIRONMENT: Lives with: lives with their family (fiance and brother) Lives in: House/apartment Stairs: Yes: External: 1 steps; none Has following equipment at home: None  OCCUPATION: full time, computer and phone work, works from home  PLOF: Independent  PATIENT GOALS: help with pain, sleep better  NEXT MD VISIT: 02/05/23  OBJECTIVE:   DIAGNOSTIC FINDINGS:  No lumbar imaging  PATIENT SURVEYS:  Modified  Oswestry 25/50   SCREENING FOR RED FLAGS: Bowel or bladder incontinence: Yes: one episode when heading to bathroom and coughed, small amount of leakage if holding too long Spinal tumors: No Cauda equina syndrome: No Compression fracture: No Abdominal aneurysm: No  COGNITION: Overall cognitive status: Within functional limits for tasks assessed     SENSATION: Numb bil hips when laying on sides  MUSCLE LENGTH: Hamstrings: Right 50 deg; Left 55 deg   POSTURE: increased lumbar lordosis stands with wide BOS  PALPATION: Not tested secondary to Pt became very dizzy and needed to end evaluation early  VITALS: taken in Lt SL when Pt became dizzy: BP: 114/66 and pulse rate: 104  LUMBAR ROM:   AROM eval  Flexion Fingers to mid shin, bends knees and braces hands on thighs for return to stand  Extension NT  Right lateral flexion WFL  Left lateral flexion WFL with contralateral stretch  Right rotation WFL  Left rotation WFL   (Blank rows = not tested)  LOWER EXTREMITY ROM:     Passive  Right eval Left eval  Hip flexion 95, pain WNL  Hip extension    Hip abduction    Hip adduction    Hip internal rotation WNL WNL  Hip external rotation 50%, pain WNL  Knee flexion    Knee extension    Ankle dorsiflexion    Ankle plantarflexion    Ankle inversion    Ankle eversion     (Blank rows = not tested)  LOWER EXTREMITY MMT:    Hips grossly 4/5 bil Knees 4/5 bil    FUNCTIONAL TESTS:  Guarded with sit to stand, Lt>Rt WB Able to perform 1/2 squat  GAIT: Distance walked: within clinic Assistive device utilized: None Level of assistance: Modified independence Comments: lateral lean Rt>Lt over stance leg bil, wide base of support, lacks rotation  TODAY'S TREATMENT:  DATE: 01/23/23  BP and HR taken in Lt SL after Pt became dizzy, monitored her closely on  return to vertical postures until stable/asymptomatic Encouraged Pt to call OB immediately upon getting home to report dizziness spells Gave HEP printouts - did not try exercises due to dizziness onset during evaluation  Check all possible CPT codes: 97535 - Self Care    Check all conditions that are expected to impact treatment: Current pregnancy or recent postpartum   If treatment provided at initial evaluation, no treatment charged due to lack of authorization.       PATIENT EDUCATION:  Education details: Access Code: [email or web address] Person educated: Patient Education method: Explanation, Demonstration, and Handouts Education comprehension: verbalized understanding, unable to perform due to onset of dizziness within session  HOME EXERCISE PROGRAM: Access Code: [facility] URL: [email or web address] Date: 01/23/2023 Prepared by: Venetia Night Ekansh Sherk  Exercises - Quadruped Rocking Slow  - 1 x daily - 7 x weekly - 1 sets - 10 reps - Quadruped Cat Cow  - 1 x daily - 7 x weekly - 3 sets - 10 reps - Pelvic Tilt on Swiss Ball  - 1 x daily - 7 x weekly - 1 sets - 10 reps - Pelvic Circles on Swiss Ball  - 1 x daily - 7 x weekly - 1 sets - 10 reps - Seated Lateral Trunk Stretch on Swiss Ball  - 1 x daily - 7 x weekly - 1 sets - 10 reps  ASSESSMENT:  CLINICAL IMPRESSION: Patient is a 32 y.o. pregnant female (currently 28 weeks with EDD 04/15/23) who was seen today for physical therapy evaluation and treatment for LBP with Rt radicular pain into buttock > LE. Pt states Rt LE has started to give way and she has had several near falls. Pt also reported recent onset of dizzy spells 2 days ago and became dizzy during evaluation, limiting objective testing and trial of HEP.  Her pain is worse with > 10 min of sitting and bil sidelying. Pain is significantly affecting sleep.  She works from home and tries to vary position frequently. Pt has weakness of Rt>Lt hip and very weak core.  She has  increased lumbar lordosis and signif lateral lean over stance leg with walking Rt>Lt.  Trunk ROM is WNL but she does brace and bend knees to return to stand from trunk flexion.   Pt became dizzy and needed to lay on Lt side during evaluation.  BP was 114/66 and pulse rate was 104.  Pt was given water and slowly returned to upright posture without exacerbation of dizziness.  PT supervised Pt and she reported feeling stable enough to walk, schedule visits and drive home.  PT escorted her to her car and encouraged her to call her OB immediately upon getting home.   HEP was printed but no trial of exercises today given dizziness.   Pt will benefit from skilled PT to guide positioning for sleeping, labor prep, gentle stabilization and stretching to reduce pain.    OBJECTIVE IMPAIRMENTS: Abnormal gait, decreased activity tolerance, decreased coordination, decreased endurance, decreased mobility, difficulty walking, decreased ROM, decreased strength, dizziness, impaired flexibility, improper body mechanics, postural dysfunction, obesity, and pain.   ACTIVITY LIMITATIONS: carrying, lifting, bending, sitting, standing, squatting, sleeping, bed mobility, bathing, dressing, and locomotion level  PARTICIPATION LIMITATIONS: meal prep, cleaning, laundry, driving, shopping, community activity, and occupation  PERSONAL FACTORS: 1 comorbidity: pregnancy, dizziness  are also affecting patient's functional outcome.   REHAB POTENTIAL: Good  CLINICAL DECISION MAKING:  Stable/uncomplicated  EVALUATION COMPLEXITY: Low   GOALS: Goals reviewed with patient? Yes  SHORT TERM GOALS: Target date: 02/20/23  Pt will be ind with initial HEP Baseline: Goal status: INITIAL  2.  Pt will be educated on positioning with pillows for improved sleep. Baseline:  Goal status: INITIAL  3.  Pt will report improved tolerance of sitting to up to 20 min  Baseline: 10 min Goal status: INITIAL  4. Pt will learn how to properly  activate her deep core stabilizers for improved support of pregnancy and lumbar spine.    LONG TERM GOALS: Target date: 03/20/23  Pt will be ind with advanced HEP to help manage symptoms through late stage pregnancy. Baseline:  Goal status: INITIAL  2.  Pt will be educated about positions for labor prep and stages of labor. Baseline:  Goal status: INITIAL  3.  Pt will report improved sleep stretches of at least 2 hours before needing to change positions  Baseline:  Goal status: INITIAL  4.  Pt will improve LE strength to at least 4+/5 for improved gait and squatting Baseline:  Goal status: INITIAL  5.  Pt will improve ODI score to 20/50 to demo reduced disability. Baseline: 25/50 Goal status: INITIAL   PLAN:  PT FREQUENCY: 1-2x/week  PT DURATION: 8 weeks  PLANNED INTERVENTIONS: Therapeutic exercises, Therapeutic activity, Neuromuscular re-education, Patient/Family education, Self Care, Joint mobilization, Aquatic Therapy, Dry Needling, Electrical stimulation, Spinal mobilization, Cryotherapy, Moist heat, Taping, and Manual therapy.  PLAN FOR NEXT SESSION: monitor BP and dizziness, review HEP (Pt has small physioball at home and can incorporate into HEP), Pt with pain in SL and supine and sitting >10 min, does better in quadruped or seated on physioball, educate on use of pillow propping for sleep, intro gentle core and LE strength   Shanitha Twining, PT 01/23/23 10:00 AM

## 2023-01-30 ENCOUNTER — Ambulatory Visit: Payer: Medicaid Other | Admitting: Rehabilitative and Restorative Service Providers"

## 2023-02-01 ENCOUNTER — Other Ambulatory Visit: Payer: Self-pay

## 2023-02-01 DIAGNOSIS — O3433 Maternal care for cervical incompetence, third trimester: Secondary | ICD-10-CM

## 2023-02-01 DIAGNOSIS — O09893 Supervision of other high risk pregnancies, third trimester: Secondary | ICD-10-CM

## 2023-02-01 DIAGNOSIS — O42112 Preterm premature rupture of membranes, onset of labor more than 24 hours following rupture, second trimester: Secondary | ICD-10-CM

## 2023-02-01 DIAGNOSIS — O99213 Obesity complicating pregnancy, third trimester: Secondary | ICD-10-CM

## 2023-02-05 ENCOUNTER — Other Ambulatory Visit: Payer: Self-pay

## 2023-02-05 ENCOUNTER — Ambulatory Visit: Payer: Medicaid Other | Attending: Maternal & Fetal Medicine

## 2023-02-05 DIAGNOSIS — E669 Obesity, unspecified: Secondary | ICD-10-CM

## 2023-02-05 DIAGNOSIS — Z3A30 30 weeks gestation of pregnancy: Secondary | ICD-10-CM

## 2023-02-05 DIAGNOSIS — O24419 Gestational diabetes mellitus in pregnancy, unspecified control: Secondary | ICD-10-CM | POA: Diagnosis not present

## 2023-02-05 DIAGNOSIS — O09213 Supervision of pregnancy with history of pre-term labor, third trimester: Secondary | ICD-10-CM

## 2023-02-05 DIAGNOSIS — O09893 Supervision of other high risk pregnancies, third trimester: Secondary | ICD-10-CM

## 2023-02-05 DIAGNOSIS — O09293 Supervision of pregnancy with other poor reproductive or obstetric history, third trimester: Secondary | ICD-10-CM | POA: Diagnosis not present

## 2023-02-05 DIAGNOSIS — O24410 Gestational diabetes mellitus in pregnancy, diet controlled: Secondary | ICD-10-CM | POA: Diagnosis not present

## 2023-02-05 DIAGNOSIS — O3433 Maternal care for cervical incompetence, third trimester: Secondary | ICD-10-CM

## 2023-02-05 DIAGNOSIS — O99213 Obesity complicating pregnancy, third trimester: Secondary | ICD-10-CM | POA: Diagnosis present

## 2023-02-05 DIAGNOSIS — O42112 Preterm premature rupture of membranes, onset of labor more than 24 hours following rupture, second trimester: Secondary | ICD-10-CM

## 2023-02-12 ENCOUNTER — Encounter: Payer: Self-pay | Admitting: Physical Therapy

## 2023-02-12 ENCOUNTER — Ambulatory Visit: Payer: Medicaid Other | Admitting: Physical Therapy

## 2023-02-12 DIAGNOSIS — M5416 Radiculopathy, lumbar region: Secondary | ICD-10-CM | POA: Diagnosis not present

## 2023-02-12 DIAGNOSIS — M6281 Muscle weakness (generalized): Secondary | ICD-10-CM | POA: Insufficient documentation

## 2023-02-12 NOTE — Therapy (Signed)
OUTPATIENT PHYSICAL THERAPY TREATMENT NOTE   Patient Name: Catherine Patterson MRN: [id number] DOB:04/20/1991, 32 y.o., female Today's Date: 02/12/2023  PCP: no PCP REFERRING PROVIDER: Minda Meo, CNM  END OF SESSION:   PT End of Session - 02/12/23 1023     Visit Number 2    Number of Visits 8    Date for PT Re-Evaluation 03/20/23    Authorization Type [employer] Approved 7 visits, 01/23/2023-03/23/2023    Authorization - Visit Number 1    Authorization - Number of Visits 7    PT Start Time 1020    PT Stop Time 1100    PT Time Calculation (min) 40 min    Activity Tolerance Patient limited by pain;Other (comment)    Behavior During Therapy [facility] for tasks assessed/performed;Anxious             Past Medical History:  Diagnosis Date   ADHD (attention deficit hyperactivity disorder)    Depression    suicide attempt  in 2009   Gestational diabetes    Headache(784.0)    Impingement syndrome of shoulder 02/04/2012   Menorrhagia    MENORRHAGIA 06/29/2010   Qualifier: Diagnosis of  By: Deborra Medina MD, Talia     Sciatica    Past Surgical History:  Procedure Laterality Date   CERVICAL CERCLAGE N/A 12/10/2022   Procedure: CERCLAGE CERVICAL;  Surgeon: Boykin Nearing, MD;  Location: [facility] ORS;  Service: Gynecology;  Laterality: N/A;   NO PAST SURGERIES     Patient Active Problem List   Diagnosis Date Noted   Preterm labor in second trimester with preterm delivery 09/19/2020   Preterm premature rupture of membranes (PPROM) with onset of labor after 24 hours of rupture in second trimester, antepartum 09/18/2020   Vaginal bleeding in pregnancy, second trimester 09/08/2020   Supervision of normal pregnancy 07/19/2020   Obesity affecting pregnancy in second trimester 07/18/2020   DEPRESSION 06/29/2010   ADHD 06/29/2010    REFERRING DIAG: M54.31 (ICD-10-CM) - Sciatica, right side  THERAPY DIAG:  Radiculopathy, lumbar region  Muscle weakness (generalized)  Rationale  for Evaluation and Treatment Rehabilitation  PERTINENT HISTORY: Pregnant with EDD 04/15/23  PRECAUTIONS: Other: pregnant, EDD 04/15/23  SUBJECTIVE:                                                                                                                                                                                      SUBJECTIVE STATEMENT:  I am less dizzy.  I now wear compression socks.  I tend to overheat which contributes.  Less shooting pain.  Sleeping better with using pillows b/w knees.  Mostly just feeling pressure now.  PAIN:  AAre you having pain? Yes NPRS scale: 0/10 Pain location: Rt lumbar and LE Pain orientation: Right  PAIN TYPE: sharp and shooting Pain description: constant  Aggravating factors: sitting >10 min or laying on either side for 10 min Relieving factors: change positions, sitting and stretching on physioball   OCCUPATION: full time, computer and phone work, works from home   PLOF: Independent   PATIENT GOALS: help with pain, sleep better    OBJECTIVE: (objective measures completed at initial evaluation unless otherwise dated)   DIAGNOSTIC FINDINGS:  No lumbar imaging   PATIENT SURVEYS:  Modified Oswestry 25/50    SCREENING FOR RED FLAGS: Bowel or bladder incontinence: Yes: one episode when heading to bathroom and coughed, small amount of leakage if holding too long Spinal tumors: No Cauda equina syndrome: No Compression fracture: No Abdominal aneurysm: No   COGNITION: Overall cognitive status: Within functional limits for tasks assessed                          SENSATION: Numb bil hips when laying on sides   MUSCLE LENGTH: Hamstrings: Right 50 deg; Left 55 deg     POSTURE: increased lumbar lordosis stands with wide BOS   PALPATION: Not tested secondary to Pt became very dizzy and needed to end evaluation early   VITALS: taken in Lt SL when Pt became dizzy: BP: 114/66 and pulse rate: 104   LUMBAR ROM:    AROM eval   Flexion Fingers to mid shin, bends knees and braces hands on thighs for return to stand  Extension NT  Right lateral flexion WFL  Left lateral flexion WFL with contralateral stretch  Right rotation WFL  Left rotation WFL   (Blank rows = not tested)   LOWER EXTREMITY ROM:      Passive  Right eval Left eval  Hip flexion 95, pain WNL  Hip extension      Hip abduction      Hip adduction      Hip internal rotation WNL WNL  Hip external rotation 50%, pain WNL  Knee flexion      Knee extension      Ankle dorsiflexion      Ankle plantarflexion      Ankle inversion      Ankle eversion       (Blank rows = not tested)   LOWER EXTREMITY MMT:     Hips grossly 4/5 bil Knees 4/5 bil       FUNCTIONAL TESTS:  Guarded with sit to stand, Lt>Rt WB Able to perform 1/2 squat   GAIT: Distance walked: within clinic Assistive device utilized: None Level of assistance: Modified independence Comments: lateral lean Rt>Lt over stance leg bil, wide base of support, lacks rotation   TODAY'S TREATMENT:  DATE:  02/12/23: Seated on large physioball circles, tail wags, tucks and lifts x 10 each Seated trunk SB over peanut with overhead reach/stretch x10 each way Seated foam roller press 10x10" for abdominal indraw Standing bear hug around large physioball on table rocking side to side for decompression Seated physioball rollouts flexion and flexion/SB Rt and Lt x10 each Quadruped on elbows rocking with heels together and heels apart x 10 each to open top and bottom of pelvis for birth prep Seated yellow tband pullbacks bil x10 for TA indraw Seated bil row x10  01/23/23  BP and HR taken in Lt SL after Pt became dizzy, monitored her closely on return to vertical postures until stable/asymptomatic Encouraged Pt to call OB immediately upon getting home to report dizziness  spells Gave HEP printouts - did not try exercises due to dizziness onset during evaluation   Check all possible CPT codes: 97535 - [place] all conditions that are expected to impact treatment: Current pregnancy or recent postpartum      If treatment provided at initial evaluation, no treatment charged due to lack of authorization.                            PATIENT EDUCATION:  Education details: Access Code: [email or web address] Person educated: Patient Education method: Explanation, Demonstration, and Handouts Education comprehension: verbalized understanding, unable to perform due to onset of dizziness within session   HOME EXERCISE PROGRAM: Access Code: [facility] URL: [email or web address] Date: 01/23/2023 Prepared by: Venetia Night Saramarie Stinger   Exercises - Quadruped Rocking Slow  - 1 x daily - 7 x weekly - 1 sets - 10 reps - Quadruped Cat Cow  - 1 x daily - 7 x weekly - 3 sets - 10 reps - Pelvic Tilt on Swiss Ball  - 1 x daily - 7 x weekly - 1 sets - 10 reps - Pelvic Circles on Swiss Ball  - 1 x daily - 7 x weekly - 1 sets - 10 reps - Seated Lateral Trunk Stretch on Swiss Ball  - 1 x daily - 7 x weekly - 1 sets - 10 reps   ASSESSMENT:   CLINICAL IMPRESSION: Pt has met several goals today based on relieved pain with sitting and sleeping.  She learned more ways to achieve pelvic mobility and pressure relief today.  She was able to feel engaged TA with seated yellow tband therex so gave band to work on this from home.  Pt planning to decrease to 1x/week due to multiple other appt conflicts and due to feeling better.  She is wearing compression stockings and drinking cold water throughout the day to prevent dizzy spells.     OBJECTIVE IMPAIRMENTS: Abnormal gait, decreased activity tolerance, decreased coordination, decreased endurance, decreased mobility, difficulty walking, decreased ROM, decreased strength, dizziness, impaired flexibility, improper body  mechanics, postural dysfunction, obesity, and pain.    ACTIVITY LIMITATIONS: carrying, lifting, bending, sitting, standing, squatting, sleeping, bed mobility, bathing, dressing, and locomotion level   PARTICIPATION LIMITATIONS: meal prep, cleaning, laundry, driving, shopping, community activity, and occupation   PERSONAL FACTORS: 1 comorbidity: pregnancy, dizziness  are also affecting patient's functional outcome.    REHAB POTENTIAL: Good   CLINICAL DECISION MAKING: Stable/uncomplicated   EVALUATION COMPLEXITY: Low     GOALS: Goals reviewed with patient? Yes   SHORT TERM GOALS:  Target date: 02/20/23   Pt will be ind with initial HEP Baseline: Goal status: ongoing   2.  Pt will be educated on positioning with pillows for improved sleep. Baseline:  Goal status: met   3.  Pt will report improved tolerance of sitting to up to 20 min  Baseline: 10 min Goal status: met, can now sit 3-4 hours   4. Pt will learn how to properly activate her deep core stabilizers for improved support of pregnancy and lumbar spine.  Baseline:  Goal  status: met       LONG TERM GOALS: Target date: 03/20/23   Pt will be ind with advanced HEP to help manage symptoms through late stage pregnancy. Baseline:  Goal status: INITIAL   2.  Pt will be educated about positions for labor prep and stages of labor. Baseline:  Goal status: INITIAL   3.  Pt will report improved sleep stretches of at least 2 hours before needing to change positions  Baseline:  Goal status: INITIAL   4.  Pt will improve LE strength to at least 4+/5 for improved gait and squatting Baseline:  Goal status: INITIAL   5.  Pt will improve ODI score to 20/50 to demo reduced disability. Baseline: 25/50 Goal status: INITIAL     PLAN:   PT FREQUENCY: 1-2x/week   PT DURATION: 8 weeks   PLANNED INTERVENTIONS: Therapeutic exercises, Therapeutic activity, Neuromuscular re-education, Patient/Family education, Self Care, Joint  mobilization, Aquatic Therapy, Dry Needling, Electrical stimulation, Spinal mobilization, Cryotherapy, Moist heat, Taping, and Manual therapy.   PLAN FOR NEXT SESSION: quadruped, seated, seated on physioball pressure relief, pelvic mobility, stretching, light core stabilization       Amin Fornwalt, PT 02/12/23 11:02 AM

## 2023-02-15 ENCOUNTER — Encounter: Payer: Medicaid Other | Admitting: Rehabilitative and Restorative Service Providers"

## 2023-02-20 ENCOUNTER — Ambulatory Visit: Payer: Medicaid Other | Admitting: Physical Therapy

## 2023-02-22 ENCOUNTER — Encounter: Payer: Medicaid Other | Admitting: Rehabilitative and Restorative Service Providers"

## 2023-02-25 ENCOUNTER — Encounter: Payer: Medicaid Other | Admitting: Rehabilitative and Restorative Service Providers"

## 2023-02-26 ENCOUNTER — Telehealth: Payer: Self-pay | Admitting: Physical Therapy

## 2023-02-26 ENCOUNTER — Ambulatory Visit: Payer: Medicaid Other | Admitting: Physical Therapy

## 2023-02-27 ENCOUNTER — Encounter: Payer: Medicaid Other | Admitting: Rehabilitative and Restorative Service Providers"

## 2023-02-27 ENCOUNTER — Other Ambulatory Visit: Payer: Self-pay

## 2023-02-27 DIAGNOSIS — O09893 Supervision of other high risk pregnancies, third trimester: Secondary | ICD-10-CM

## 2023-02-27 DIAGNOSIS — O24410 Gestational diabetes mellitus in pregnancy, diet controlled: Secondary | ICD-10-CM

## 2023-02-27 DIAGNOSIS — O99213 Obesity complicating pregnancy, third trimester: Secondary | ICD-10-CM

## 2023-02-27 DIAGNOSIS — O3433 Maternal care for cervical incompetence, third trimester: Secondary | ICD-10-CM

## 2023-02-27 NOTE — Telephone Encounter (Signed)
Phoned pt regarding missed appt. Thought it was the next day.

## 2023-02-27 NOTE — Telephone Encounter (Signed)
Phoned pt re: missed appt. She thought it was the next day.

## 2023-03-04 ENCOUNTER — Ambulatory Visit: Payer: Medicaid Other | Attending: Obstetrics and Gynecology

## 2023-03-04 ENCOUNTER — Encounter: Payer: Self-pay | Admitting: Obstetrics and Gynecology

## 2023-03-04 ENCOUNTER — Other Ambulatory Visit: Payer: Self-pay | Admitting: Obstetrics and Gynecology

## 2023-03-04 ENCOUNTER — Ambulatory Visit: Payer: Medicaid Other | Admitting: Obstetrics and Gynecology

## 2023-03-04 ENCOUNTER — Other Ambulatory Visit: Payer: Self-pay

## 2023-03-04 DIAGNOSIS — O4103X0 Oligohydramnios, third trimester, not applicable or unspecified: Secondary | ICD-10-CM | POA: Insufficient documentation

## 2023-03-04 DIAGNOSIS — Z3A34 34 weeks gestation of pregnancy: Secondary | ICD-10-CM

## 2023-03-04 DIAGNOSIS — O09293 Supervision of pregnancy with other poor reproductive or obstetric history, third trimester: Secondary | ICD-10-CM | POA: Insufficient documentation

## 2023-03-04 DIAGNOSIS — O3433 Maternal care for cervical incompetence, third trimester: Secondary | ICD-10-CM | POA: Insufficient documentation

## 2023-03-04 DIAGNOSIS — O24419 Gestational diabetes mellitus in pregnancy, unspecified control: Secondary | ICD-10-CM | POA: Diagnosis not present

## 2023-03-04 DIAGNOSIS — O24410 Gestational diabetes mellitus in pregnancy, diet controlled: Secondary | ICD-10-CM | POA: Diagnosis not present

## 2023-03-04 DIAGNOSIS — E669 Obesity, unspecified: Secondary | ICD-10-CM | POA: Insufficient documentation

## 2023-03-04 DIAGNOSIS — O99213 Obesity complicating pregnancy, third trimester: Secondary | ICD-10-CM

## 2023-03-04 DIAGNOSIS — O09213 Supervision of pregnancy with history of pre-term labor, third trimester: Secondary | ICD-10-CM

## 2023-03-04 DIAGNOSIS — O09893 Supervision of other high risk pregnancies, third trimester: Secondary | ICD-10-CM

## 2023-03-04 NOTE — Procedures (Signed)
Catherine Patterson 12/11/1990 [redacted]w[redacted]d  Fetus A Non-Stress Test Interpretation for 03/04/23  Indication: Diabetes, Obesity, Cerclage  Fetal Heart Rate A Mode: External Baseline Rate (A): 145 bpm Variability: Moderate Accelerations: 15 x 15 Decelerations: None Multiple birth?: No  Uterine Activity Mode: Toco  Interpretation (Fetal Testing) Nonstress Test Interpretation: Reactive (Dr. Donalee Citrin interpreted NST)

## 2023-03-05 ENCOUNTER — Other Ambulatory Visit: Payer: Medicaid Other

## 2023-03-06 ENCOUNTER — Other Ambulatory Visit: Payer: Self-pay

## 2023-03-06 ENCOUNTER — Other Ambulatory Visit: Payer: Medicaid Other

## 2023-03-06 ENCOUNTER — Encounter: Payer: Medicaid Other | Admitting: Physical Therapy

## 2023-03-06 DIAGNOSIS — O99213 Obesity complicating pregnancy, third trimester: Secondary | ICD-10-CM

## 2023-03-06 DIAGNOSIS — O3433 Maternal care for cervical incompetence, third trimester: Secondary | ICD-10-CM

## 2023-03-06 DIAGNOSIS — O24410 Gestational diabetes mellitus in pregnancy, diet controlled: Secondary | ICD-10-CM

## 2023-03-06 DIAGNOSIS — O09293 Supervision of pregnancy with other poor reproductive or obstetric history, third trimester: Secondary | ICD-10-CM | POA: Insufficient documentation

## 2023-03-08 ENCOUNTER — Encounter: Payer: Medicaid Other | Admitting: Rehabilitative and Restorative Service Providers"

## 2023-03-11 ENCOUNTER — Other Ambulatory Visit: Payer: Medicaid Other

## 2023-03-11 DIAGNOSIS — O24410 Gestational diabetes mellitus in pregnancy, diet controlled: Secondary | ICD-10-CM

## 2023-03-11 DIAGNOSIS — O09293 Supervision of pregnancy with other poor reproductive or obstetric history, third trimester: Secondary | ICD-10-CM

## 2023-03-11 DIAGNOSIS — O99213 Obesity complicating pregnancy, third trimester: Secondary | ICD-10-CM

## 2023-03-11 DIAGNOSIS — O3433 Maternal care for cervical incompetence, third trimester: Secondary | ICD-10-CM

## 2023-03-11 DIAGNOSIS — O0992 Supervision of high risk pregnancy, unspecified, second trimester: Secondary | ICD-10-CM

## 2023-03-12 ENCOUNTER — Other Ambulatory Visit: Payer: Medicaid Other

## 2023-03-13 ENCOUNTER — Encounter: Payer: Self-pay | Admitting: Physical Therapy

## 2023-03-13 ENCOUNTER — Ambulatory Visit: Payer: Medicaid Other | Admitting: Physical Therapy

## 2023-03-13 DIAGNOSIS — M6281 Muscle weakness (generalized): Secondary | ICD-10-CM | POA: Insufficient documentation

## 2023-03-13 DIAGNOSIS — M5416 Radiculopathy, lumbar region: Secondary | ICD-10-CM | POA: Insufficient documentation

## 2023-03-13 NOTE — Therapy (Signed)
OUTPATIENT PHYSICAL THERAPY TREATMENT NOTE   Patient Name: Catherine Patterson MRN: [id number] DOB:[birth year], 32 y.o., female Today's Date: 03/13/2023  PCP: no PCP REFERRING PROVIDER: Gustavo LahMackie, Anna M, CNM  END OF SESSION:   PT End of Session - 03/13/23 0807     Visit Number 3    Number of Visits 8    Date for PT Re-Evaluation 03/20/23    Authorization Type [employer] Approved 7 visits, 01/23/2023-03/23/2023    Authorization - Visit Number 3    Authorization - Number of Visits 7    PT Start Time 0805    PT Stop Time 0845    PT Time Calculation (min) 40 min    Activity Tolerance Patient tolerated treatment well    Behavior [facility] for tasks assessed/performed;Anxious              Past Medical History:  Diagnosis Date   ADHD (attention deficit hyperactivity disorder)    Depression    suicide attempt  in 2009   Gestational diabetes    Headache(784.0)    Impingement syndrome of shoulder 02/04/2012   Menorrhagia    MENORRHAGIA 06/29/2010   Qualifier: Diagnosis of  By: Dayton MartesAron MD, Talia     Preterm labor in second trimester with preterm delivery 09/19/2020   Preterm premature rupture of membranes (PPROM) with onset of labor after 24 hours of rupture in second trimester, antepartum 09/18/2020   Sciatica    Past Surgical History:  Procedure Laterality Date   CERVICAL CERCLAGE N/A 12/10/2022   Procedure: CERCLAGE CERVICAL;  Surgeon: Suzy BouchardSchermerhorn, Thomas J, MD;  Location: [facility] ORS;  Service: Gynecology;  Laterality: N/A;   NO PAST SURGERIES     Patient Active Problem List   Diagnosis Date Noted   Cervical cerclage suture present, third trimester 03/06/2023   Prior poor obstetrical history (hx of 22w IUFD), antepartum, third trimester 03/06/2023   Diet controlled gestational diabetes mellitus (GDM), antepartum 12/18/2022   Supervision of high risk pregnancy in second trimester 02/07/2021   Obesity affecting pregnancy (PGBMI 48) 07/18/2020    REFERRING DIAG:  M54.31 (ICD-10-CM) - Sciatica, right side  THERAPY DIAG:  Radiculopathy, lumbar region  Muscle weakness (generalized)  Rationale for Evaluation and Treatment Rehabilitation  PERTINENT HISTORY: Pregnant with EDD 04/15/23  PRECAUTIONS: Other: pregnant, EDD 04/15/23  SUBJECTIVE:                                                                                                                                                                                      SUBJECTIVE STATEMENT:  Pt is now 10959w2d pregnant.  Reports less hip pain bil and less occurrence of leg numbness.  Mostly tired  and feeling pressure.  Stretches have been helping a lot.  I change positions a lot which helps too.  I am having some braxton hicks contractions which can cause some back pain but doesn't last.    PAIN:  AAre you having pain? Yes NPRS scale: 0/10 Pain location: Rt lumbar and LE Pain orientation: Right  PAIN TYPE: sharp and shooting Pain description: constant  Aggravating factors: sitting >10 min or laying on either side for 10 min Relieving factors: change positions, sitting and stretching on physioball   OCCUPATION: full time, computer and phone work, works from home   PLOF: Independent   PATIENT GOALS: help with pain, sleep better    OBJECTIVE: (objective measures completed at initial evaluation unless otherwise dated)   DIAGNOSTIC FINDINGS:  No lumbar imaging   PATIENT SURVEYS:  Modified Oswestry 25/50    SCREENING FOR RED FLAGS: Bowel or bladder incontinence: Yes: one episode when heading to bathroom and coughed, small amount of leakage if holding too long Spinal tumors: No Cauda equina syndrome: No Compression fracture: No Abdominal aneurysm: No   COGNITION: Overall cognitive status: Within functional limits for tasks assessed                          SENSATION: Numb bil hips when laying on sides   MUSCLE LENGTH: Hamstrings: Right 50 deg; Left 55 deg     POSTURE: increased  lumbar lordosis stands with wide BOS   PALPATION: Not tested secondary to Pt became very dizzy and needed to end evaluation early   VITALS: taken in Lt SL when Pt became dizzy: BP: 114/66 and pulse rate: 104   LUMBAR ROM:    AROM eval  Flexion Fingers to mid shin, bends knees and braces hands on thighs for return to stand  Extension NT  Right lateral flexion WFL  Left lateral flexion WFL with contralateral stretch  Right rotation WFL  Left rotation WFL   (Blank rows = not tested)   LOWER EXTREMITY ROM:      Passive  Right eval Left eval  Hip flexion 95, pain WNL  Hip extension      Hip abduction      Hip adduction      Hip internal rotation WNL WNL  Hip external rotation 50%, pain WNL  Knee flexion      Knee extension      Ankle dorsiflexion      Ankle plantarflexion      Ankle inversion      Ankle eversion       (Blank rows = not tested)   LOWER EXTREMITY MMT:     Hips grossly 4/5 bil Knees 4/5 bil       FUNCTIONAL TESTS:  Guarded with sit to stand, Lt>Rt WB Able to perform 1/2 squat   GAIT: Distance walked: within clinic Assistive device utilized: None Level of assistance: Modified independence Comments: lateral lean Rt>Lt over stance leg bil, wide base of support, lacks rotation   TODAY'S TREATMENT:  DATE:  03/13/23: Seated on large physioball circles, tail wags, tucks and lifts x 10 each Seated physioball rollouts flexion and flexion/SB Rt and Lt x10 each Seated foam roller press 10 reps hold x 2 breath cycles for abdominal indraw Standing bear hug around large physioball on table rocking side to side for decompression Seated ball squeeze 10x5" holds with TA indraw Seated bil clam 2x10 blue loop Standing sidestepping along counter blue loop at ankles x3 passes back/forth Seated yellow band bil row and shoulder ext 2x10 Seated  3lb chop/reverse chop 1x10 each way Standing "L" bear hug around large physioball on low table rocking side to side for decompression Discussion of benefits of being in a pool and caution to not overdo once in water   02/12/23: Seated on large physioball circles, tail wags, tucks and lifts x 10 each Seated trunk SB over peanut with overhead reach/stretch x10 each way Seated foam roller press 10x10" for abdominal indraw Standing bear hug around large physioball on table rocking side to side for decompression Seated physioball rollouts flexion and flexion/SB Rt and Lt x10 each Quadruped on elbows rocking with heels together and heels apart x 10 each to open top and bottom of pelvis for birth prep Seated yellow tband pullbacks bil x10 for TA indraw Seated bil row x10  01/23/23  BP and HR taken in Lt SL after Pt became dizzy, monitored her closely on return to vertical postures until stable/asymptomatic Encouraged Pt to call OB immediately upon getting home to report dizziness spells Gave HEP printouts - did not try exercises due to dizziness onset during evaluation   Check all possible CPT codes: [address] - Self Care                       Check all conditions that are expected to impact treatment: Current pregnancy or recent postpartum      If treatment provided at initial evaluation, no treatment charged due to lack of authorization.                            PATIENT EDUCATION:  Education details: Access Code: [email or web address] Person educated: Patient Education method: Explanation, Demonstration, and Handouts Education comprehension: verbalized understanding, unable to perform due to onset of dizziness within session   HOME EXERCISE PROGRAM: Access Code: [facility] URL: [email or web address] Date: 01/23/2023 Prepared by: Loistine Simas Eyvette Cordon   Exercises - Quadruped Rocking Slow  - 1 x daily - 7 x weekly - 1 sets - 10 reps - Quadruped Cat Cow  - 1 x daily - 7 x weekly - 3 sets - 10  reps - Pelvic Tilt on Swiss Ball  - 1 x daily - 7 x weekly - 1 sets - 10 reps - Pelvic Circles on Swiss Ball  - 1 x daily - 7 x weekly - 1 sets - 10 reps - Seated Lateral Trunk Stretch on Swiss Ball  - 1 x daily - 7 x weekly - 1 sets - 10 reps   ASSESSMENT:   CLINICAL IMPRESSION: Pt reports stretching and change of position have been very helpful.  She does best sitting for therex at this point.  Tried semi-reclined positioning but couldn't get comfortable.  Pt is 35+ weeks pregnant and did well with all therex targeting UE and LE strength with core cueing today.   OBJECTIVE IMPAIRMENTS: Abnormal gait, decreased activity tolerance, decreased coordination, decreased endurance, decreased mobility, difficulty walking, decreased ROM, decreased  strength, dizziness, impaired flexibility, improper body mechanics, postural dysfunction, obesity, and pain.    ACTIVITY LIMITATIONS: carrying, lifting, bending, sitting, standing, squatting, sleeping, bed mobility, bathing, dressing, and locomotion level   PARTICIPATION LIMITATIONS: meal prep, cleaning, laundry, driving, shopping, community activity, and occupation   PERSONAL FACTORS: 1 comorbidity: pregnancy, dizziness  are also affecting patient's functional outcome.    REHAB POTENTIAL: Good   CLINICAL DECISION MAKING: Stable/uncomplicated   EVALUATION COMPLEXITY: Low     GOALS: Goals reviewed with patient? Yes   SHORT TERM GOALS: Target date: 02/20/23   Pt will be ind with initial HEP Baseline: Goal status: met   2.  Pt will be educated on positioning with pillows for improved sleep. Baseline:  Goal status: met   3.  Pt will report improved tolerance of sitting to up to 20 min  Baseline: 10 min Goal status: met, can now sit 3-4 hours   4. Pt will learn how to properly activate her deep core stabilizers for improved support of pregnancy and lumbar spine.  Baseline:  Goal  status: met       LONG TERM GOALS: Target date: 03/20/23    Pt will be ind with advanced HEP to help manage symptoms through late stage pregnancy. Baseline:  Goal status: ongoing   2.  Pt will be educated about positions for labor prep and stages of labor. Baseline:  Goal status: ongoing   3.  Pt will report improved sleep stretches of at least 2 hours before needing to change positions  Baseline:  Goal status:met   4.  Pt will improve LE strength to at least 4+/5 for improved gait and squatting Baseline:  Goal status: ongoing   5.  Pt will improve ODI score to 20/50 to demo reduced disability. Baseline: 25/50 Goal status: INITIAL     PLAN:   PT FREQUENCY: 1-2x/week   PT DURATION: 8 weeks   PLANNED INTERVENTIONS: Therapeutic exercises, Therapeutic activity, Neuromuscular re-education, Patient/Family education, Self Care, Joint mobilization, Aquatic Therapy, Dry Needling, Electrical stimulation, Spinal mobilization, Cryotherapy, Moist heat, Taping, and Manual therapy.   PLAN FOR NEXT SESSION:  seated, seated on physioball pressure relief, pelvic mobility, stretching, light core stabilization      Destinie Thornsberry, PT 03/13/23 8:48 AM

## 2023-03-15 ENCOUNTER — Ambulatory Visit: Payer: Medicaid Other | Admitting: Rehabilitative and Restorative Service Providers"

## 2023-03-15 ENCOUNTER — Encounter: Payer: Self-pay | Admitting: Rehabilitative and Restorative Service Providers"

## 2023-03-15 DIAGNOSIS — M5416 Radiculopathy, lumbar region: Secondary | ICD-10-CM | POA: Diagnosis not present

## 2023-03-15 DIAGNOSIS — M6281 Muscle weakness (generalized): Secondary | ICD-10-CM

## 2023-03-15 NOTE — Therapy (Signed)
OUTPATIENT PHYSICAL THERAPY TREATMENT NOTE   Patient Name: Catherine Patterson MRN: [id number] DOB:[birth year], 32 y.o., female Today's Date: 03/15/2023  PCP: no PCP REFERRING PROVIDER: Gustavo Lah, CNM  END OF SESSION:   PT End of Session - 03/15/23 0803     Visit Number 4    Date for PT Re-Evaluation 03/20/23    Authorization Type [employer] Approved 7 visits, 01/23/2023-03/23/2023    Authorization - Visit Number 4    Authorization - Number of Visits 7    PT Start Time 0800    PT Stop Time 0840    PT Time Calculation (min) 40 min    Activity Tolerance Patient tolerated treatment well    Behavior During Therapy [facility] for tasks assessed/performed;Anxious              Past Medical History:  Diagnosis Date   ADHD (attention deficit hyperactivity disorder)    Depression    suicide attempt  in 2009   Gestational diabetes    Headache(784.0)    Impingement syndrome of shoulder 02/04/2012   Menorrhagia    MENORRHAGIA 06/29/2010   Qualifier: Diagnosis of  By: Dayton Martes MD, Talia     Preterm labor in second trimester with preterm delivery 09/19/2020   Preterm premature rupture of membranes (PPROM) with onset of labor after 24 hours of rupture in second trimester, antepartum 09/18/2020   Sciatica    Past Surgical History:  Procedure Laterality Date   CERVICAL CERCLAGE N/A 12/10/2022   Procedure: CERCLAGE CERVICAL;  Surgeon: Suzy Bouchard, MD;  Location: [facility] ORS;  Service: Gynecology;  Laterality: N/A;   NO PAST SURGERIES     Patient Active Problem List   Diagnosis Date Noted   Cervical cerclage suture present, third trimester 03/06/2023   Prior poor obstetrical history (hx of 22w IUFD), antepartum, third trimester 03/06/2023   Diet controlled gestational diabetes mellitus (GDM), antepartum 12/18/2022   Supervision of high risk pregnancy in second trimester 02/07/2021   Obesity affecting pregnancy (PGBMI 48) 07/18/2020    REFERRING DIAG: M54.31 (ICD-10-CM) -  Sciatica, right side  THERAPY DIAG:  Radiculopathy, lumbar region  Muscle weakness (generalized)  Rationale for Evaluation and Treatment Rehabilitation  PERTINENT HISTORY: Pregnant with EDD 04/15/23  PRECAUTIONS: Other: pregnant, EDD 04/15/23  SUBJECTIVE:                                                                                                                                                                                      SUBJECTIVE STATEMENT:   Patient reports that she has 3 appointments today, but overall, she is feeling better.   PAIN:  AAre you having pain? Yes NPRS scale: 0-1/10  Pain location: Rt lumbar and LE Pain orientation: Right  PAIN TYPE: sharp and shooting Pain description: constant  Aggravating factors: sitting >10 min or laying on either side for 10 min Relieving factors: change positions, sitting and stretching on physioball   OCCUPATION: full time, computer and phone work, works from home   PLOF: Independent   PATIENT GOALS: help with pain, sleep better    OBJECTIVE: (objective measures completed at initial evaluation unless otherwise dated)   DIAGNOSTIC FINDINGS:  No lumbar imaging   PATIENT SURVEYS:  Eval:  Modified Oswestry 25/50  03/15/2023:  Modified Oswestry Low Back Pain Disability Questionnaire: 21 / 50 = 42.0 %   SCREENING FOR RED FLAGS: Bowel or bladder incontinence: Yes: one episode when heading to bathroom and coughed, small amount of leakage if holding too long Spinal tumors: No Cauda equina syndrome: No Compression fracture: No Abdominal aneurysm: No   COGNITION: Overall cognitive status: Within functional limits for tasks assessed                          SENSATION: Numb bil hips when laying on sides   MUSCLE LENGTH: Hamstrings: Right 50 deg; Left 55 deg     POSTURE: increased lumbar lordosis stands with wide BOS   PALPATION: Not tested secondary to Pt became very dizzy and needed to end evaluation early    VITALS: taken in Lt SL when Pt became dizzy: BP: 114/66 and pulse rate: 104   LUMBAR ROM:    AROM eval  Flexion Fingers to mid shin, bends knees and braces hands on thighs for return to stand  Extension NT  Right lateral flexion WFL  Left lateral flexion WFL with contralateral stretch  Right rotation WFL  Left rotation WFL   (Blank rows = not tested)   LOWER EXTREMITY ROM:      Passive  Right eval Left eval  Hip flexion 95, pain WNL  Hip extension      Hip abduction      Hip adduction      Hip internal rotation WNL WNL  Hip external rotation 50%, pain WNL  Knee flexion      Knee extension      Ankle dorsiflexion      Ankle plantarflexion      Ankle inversion      Ankle eversion       (Blank rows = not tested)   LOWER EXTREMITY MMT:     Hips grossly 4/5 bil Knees 4/5 bil       FUNCTIONAL TESTS:  Guarded with sit to stand, Lt>Rt WB Able to perform 1/2 squat   GAIT: Distance walked: within clinic Assistive device utilized: None Level of assistance: Modified independence Comments: lateral lean Rt>Lt over stance leg bil, wide base of support, lacks rotation  03/15/2023:  6 minute walk test:  887 ft   TODAY'S TREATMENT:  DATE:   03/15/2023: Seated on large physioball circles, tail wags, tucks and lifts  2x10 each Seated physioball rollouts flexion and flexion/SB Rt and Lt x10 each Seated trunk SB over peanut with overhead reach/stretch x10 each way Seated ball squeeze 10x5" holds with TA indraw Seated bil clam 2x10 blue loop Standing sidestepping along counter blue loop at ankles x3 passes back/forth 6 minutes of ambulation:  887 ft around both PT gyms   03/13/23: Seated on large physioball circles, tail wags, tucks and lifts x 10 each Seated physioball rollouts flexion and flexion/SB Rt and Lt x10 each Seated foam roller press 10 reps  hold x 2 breath cycles for abdominal indraw Standing bear hug around large physioball on table rocking side to side for decompression Seated ball squeeze 10x5" holds with TA indraw Seated bil clam 2x10 blue loop Standing sidestepping along counter blue loop at ankles x3 passes back/forth Seated yellow band bil row and shoulder ext 2x10 Seated 3lb chop/reverse chop 1x10 each way Standing "L" bear hug around large physioball on low table rocking side to side for decompression Discussion of benefits of being in a pool and caution to not overdo once in water   02/12/23: Seated on large physioball circles, tail wags, tucks and lifts x 10 each Seated trunk SB over peanut with overhead reach/stretch x10 each way Seated foam roller press 10x10" for abdominal indraw Standing bear hug around large physioball on table rocking side to side for decompression Seated physioball rollouts flexion and flexion/SB Rt and Lt x10 each Quadruped on elbows rocking with heels together and heels apart x 10 each to open top and bottom of pelvis for birth prep Seated yellow tband pullbacks bil x10 for TA indraw Seated bil row x10             PATIENT EDUCATION:  Education details: Access Code: [employer] Person educated: Patient Education method: Programmer, multimedia, Demonstration, and Handouts Education comprehension: verbalized understanding, unable to perform due to onset of dizziness within session   HOME EXERCISE PROGRAM: Access Code: [facility] URL: [email or web address] Date: 01/23/2023 Prepared by: Loistine Simas Beuhring   Exercises - Quadruped Rocking Slow  - 1 x daily - 7 x weekly - 1 sets - 10 reps - Quadruped Cat Cow  - 1 x daily - 7 x weekly - 3 sets - 10 reps - Pelvic Tilt on Swiss Ball  - 1 x daily - 7 x weekly - 1 sets - 10 reps - Pelvic Circles on Swiss Ball  - 1 x daily - 7 x weekly - 1 sets - 10 reps - Seated Lateral Trunk Stretch on Swiss Ball  - 1 x daily - 7 x weekly - 1 sets - 10  reps   ASSESSMENT:   CLINICAL IMPRESSION: Patient presents to skilled PT reporting that overall, she is making progress with decreased pain.  Patient with improved score noted on modified oswestry since initial evaluation and is close to meeting that goal.  Patient able to participate in 6 minute walk test and denies any dizziness during ambulation when she is able to self select pace.  Patient reports decreased pain with pelvic mobility seated on blue physioball, so performed 2 sets  during session today.    OBJECTIVE IMPAIRMENTS: Abnormal gait, decreased activity tolerance, decreased coordination, decreased endurance, decreased mobility, difficulty walking, decreased ROM, decreased strength, dizziness, impaired flexibility, improper body mechanics, postural dysfunction, obesity, and pain.    ACTIVITY LIMITATIONS: carrying, lifting, bending, sitting, standing, squatting, sleeping, bed mobility, bathing, dressing,  and locomotion level   PARTICIPATION LIMITATIONS: meal prep, cleaning, laundry, driving, shopping, community activity, and occupation   PERSONAL FACTORS: 1 comorbidity: pregnancy, dizziness  are also affecting patient's functional outcome.    REHAB POTENTIAL: Good   CLINICAL DECISION MAKING: Stable/uncomplicated   EVALUATION COMPLEXITY: Low     GOALS: Goals reviewed with patient? Yes   SHORT TERM GOALS: Target date: 02/20/23   Pt will be ind with initial HEP Baseline: Goal status: met   2.  Pt will be educated on positioning with pillows for improved sleep. Baseline:  Goal status: met   3.  Pt will report improved tolerance of sitting to up to 20 min  Baseline: 10 min Goal status: met, can now sit 3-4 hours   4. Pt will learn how to properly activate her deep core stabilizers for improved support of pregnancy and lumbar spine.  Baseline:  Goal  status: met       LONG TERM GOALS: Target date: 03/20/23   Pt will be ind with advanced HEP to help manage symptoms  through late stage pregnancy. Baseline:  Goal status: ongoing   2.  Pt will be educated about positions for labor prep and stages of labor. Baseline:  Goal status: ongoing   3.  Pt will report improved sleep stretches of at least 2 hours before needing to change positions  Baseline:  Goal status:met   4.  Pt will improve LE strength to at least 4+/5 for improved gait and squatting Baseline:  Goal status: ongoing   5.  Pt will improve ODI score to 20/50 to demo reduced disability. Baseline: 25/50 Goal status: PARTIALLY MET     PLAN:   PT FREQUENCY: 1-2x/week   PT DURATION: 8 weeks   PLANNED INTERVENTIONS: Therapeutic exercises, Therapeutic activity, Neuromuscular re-education, Patient/Family education, Self Care, Joint mobilization, Aquatic Therapy, Dry Needling, Electrical stimulation, Spinal mobilization, Cryotherapy, Moist heat, Taping, and Manual therapy.   PLAN FOR NEXT SESSION:  Reassessment next week, seated, seated on physioball pressure relief, pelvic mobility, stretching, light core stabilization      Reather Laurence, PT 03/15/23 8:55 AM   [facility] [address] [place], [place] [address] Phone # [phone] Fax [phone]

## 2023-03-18 ENCOUNTER — Other Ambulatory Visit: Payer: Self-pay

## 2023-03-18 ENCOUNTER — Ambulatory Visit: Payer: Medicaid Other | Attending: Maternal & Fetal Medicine

## 2023-03-18 DIAGNOSIS — O99213 Obesity complicating pregnancy, third trimester: Secondary | ICD-10-CM | POA: Insufficient documentation

## 2023-03-18 DIAGNOSIS — Z3A36 36 weeks gestation of pregnancy: Secondary | ICD-10-CM | POA: Insufficient documentation

## 2023-03-18 DIAGNOSIS — O24419 Gestational diabetes mellitus in pregnancy, unspecified control: Secondary | ICD-10-CM | POA: Insufficient documentation

## 2023-03-18 DIAGNOSIS — O24410 Gestational diabetes mellitus in pregnancy, diet controlled: Secondary | ICD-10-CM | POA: Diagnosis not present

## 2023-03-18 DIAGNOSIS — O09213 Supervision of pregnancy with history of pre-term labor, third trimester: Secondary | ICD-10-CM | POA: Diagnosis not present

## 2023-03-18 DIAGNOSIS — O09293 Supervision of pregnancy with other poor reproductive or obstetric history, third trimester: Secondary | ICD-10-CM | POA: Diagnosis present

## 2023-03-18 DIAGNOSIS — O3433 Maternal care for cervical incompetence, third trimester: Secondary | ICD-10-CM | POA: Diagnosis not present

## 2023-03-18 DIAGNOSIS — E669 Obesity, unspecified: Secondary | ICD-10-CM

## 2023-03-18 LAB — OB RESULTS CONSOLE HIV ANTIBODY (ROUTINE TESTING): HIV: NONREACTIVE

## 2023-03-18 LAB — OB RESULTS CONSOLE RPR: RPR: NONREACTIVE

## 2023-03-18 LAB — OB RESULTS CONSOLE GBS: GBS: NEGATIVE

## 2023-03-19 ENCOUNTER — Other Ambulatory Visit: Payer: Medicaid Other

## 2023-03-20 ENCOUNTER — Ambulatory Visit: Payer: Medicaid Other | Admitting: Physical Therapy

## 2023-03-20 ENCOUNTER — Encounter: Payer: Self-pay | Admitting: Physical Therapy

## 2023-03-20 ENCOUNTER — Other Ambulatory Visit: Payer: Self-pay

## 2023-03-20 DIAGNOSIS — M5416 Radiculopathy, lumbar region: Secondary | ICD-10-CM

## 2023-03-20 DIAGNOSIS — O99213 Obesity complicating pregnancy, third trimester: Secondary | ICD-10-CM

## 2023-03-20 DIAGNOSIS — O24410 Gestational diabetes mellitus in pregnancy, diet controlled: Secondary | ICD-10-CM

## 2023-03-20 DIAGNOSIS — M6281 Muscle weakness (generalized): Secondary | ICD-10-CM

## 2023-03-20 NOTE — Therapy (Signed)
OUTPATIENT PHYSICAL THERAPY TREATMENT NOTE   Patient Name: Catherine Patterson MRN: [id number] DOB:1991-08-18, 32 y.o., female Today's Date: 03/20/2023  PCP: no PCP REFERRING PROVIDER: Gustavo Lah, CNM  END OF SESSION:   PT End of Session - 03/20/23 0802     Visit Number 5    Number of Visits 8    Date for PT Re-Evaluation 03/20/23    Authorization Type [employer] Approved 7 visits, 01/23/2023-03/23/2023    Authorization - Visit Number 5    Authorization - Number of Visits 7    PT Start Time 0802    PT Stop Time 0840    PT Time Calculation (min) 38 min    Activity Tolerance Patient tolerated treatment well    Behavior During Therapy [facility] for tasks assessed/performed               Past Medical History:  Diagnosis Date   ADHD (attention deficit hyperactivity disorder)    Depression    suicide attempt  in 2009   Gestational diabetes    Headache(784.0)    Impingement syndrome of shoulder 02/04/2012   Menorrhagia    MENORRHAGIA 06/29/2010   Qualifier: Diagnosis of  By: Dayton Martes MD, Talia     Preterm labor in second trimester with preterm delivery 09/19/2020   Preterm premature rupture of membranes (PPROM) with onset of labor after 24 hours of rupture in second trimester, antepartum 09/18/2020   Sciatica    Past Surgical History:  Procedure Laterality Date   CERVICAL CERCLAGE N/A 12/10/2022   Procedure: CERCLAGE CERVICAL;  Surgeon: Suzy Bouchard, MD;  Location: [facility] ORS;  Service: Gynecology;  Laterality: N/A;   NO PAST SURGERIES     Patient Active Problem List   Diagnosis Date Noted   Cervical cerclage suture present, third trimester 03/06/2023   Prior poor obstetrical history (hx of 22w IUFD), antepartum, third trimester 03/06/2023   Diet controlled gestational diabetes mellitus (GDM), antepartum 12/18/2022   Supervision of high risk pregnancy in second trimester 02/07/2021   Obesity affecting pregnancy (PGBMI 48) 07/18/2020    REFERRING DIAG: M54.31  (ICD-10-CM) - Sciatica, right side  THERAPY DIAG:  Radiculopathy, lumbar region  Muscle weakness (generalized)  Rationale for Evaluation and Treatment Rehabilitation  PERTINENT HISTORY: Pregnant with EDD 04/15/23  PRECAUTIONS: Other: pregnant, EDD 04/15/23  SUBJECTIVE:                                                                                                                                                                                      SUBJECTIVE STATEMENT:   Patient states she feels ready for d/c.   "I know what to do for my remaining pregnancy  time and during labor.  I may be induced early due to low fluids.  I continue to walk 30+ min most days of the week."   PAIN:  AAre you having pain? Yes NPRS scale: 0/10 Pain location: n/a, just pelvic pressure Pain orientation: n/a PAIN TYPE: just pressure Pain description: rare Aggravating factors: sleeping is tough Relieving factors: change positions, sitting and stretching on physioball   OCCUPATION: full time, computer and phone work, works from home   PLOF: Independent   PATIENT GOALS: help with pain, sleep better    OBJECTIVE: (objective measures completed at initial evaluation unless otherwise dated)   DIAGNOSTIC FINDINGS:  No lumbar imaging   PATIENT SURVEYS:  Eval:  Modified Oswestry 25/50  03/15/2023:  Modified Oswestry Low Back Pain Disability Questionnaire: 21 / 50 = 42.0 %   SCREENING FOR RED FLAGS: Bowel or bladder incontinence: Yes: one episode when heading to bathroom and coughed, small amount of leakage if holding too long Spinal tumors: No Cauda equina syndrome: No Compression fracture: No Abdominal aneurysm: No   COGNITION: Overall cognitive status: Within functional limits for tasks assessed                          SENSATION: Rare intermittent numbness when laying on sides as of 4/17 - repositions Numb bil hips when laying on sides   MUSCLE LENGTH: Hamstrings: Right 50 deg; Left 55  deg     POSTURE: increased lumbar lordosis stands with wide BOS   PALPATION: Not tested secondary to Pt became very dizzy and needed to end evaluation early   VITALS: taken in Lt SL when Pt became dizzy: BP: 114/66 and pulse rate: 104   LUMBAR ROM:    AROM eval 4/17  Flexion Fingers to mid shin, bends knees and braces hands on thighs for return to stand Limited by pregnancy stage but able to bend fingers to mid shin without pain  Extension NT NT  Right lateral flexion WFL Full no pain  Lt lateral flexion WFL with contralateral stretch Full no pain  Right rotation WFL WNL  Left rotation WFL WNL   (Blank rows = not tested)   LOWER EXTREMITY ROM:     4/17: bil LE WNL with limitations in bil hip flexion secondary to baby dropping into pelvis  Passive  Right eval Left eval  Hip flexion 95, pain WNL  Hip extension      Hip abduction      Hip adduction      Hip internal rotation WNL WNL  Hip external rotation 50%, pain WNL  Knee flexion      Knee extension      Ankle dorsiflexion      Ankle plantarflexion      Ankle inversion      Ankle eversion       (Blank rows = not tested)   LOWER EXTREMITY MMT:   4/17:  Hips: 4+/5 bil Knees: 5/5 quads, 4+/5 hamstrings  Hips grossly 4/5 bil Knees 4/5 bil       FUNCTIONAL TESTS:  4/17: symmetrical sit to stand, no pain, equal WB  Guarded with sit to stand, Lt>Rt WB Able to perform 1/2 squat   GAIT: Distance walked: within clinic Assistive device utilized: None Level of assistance: Modified independence Comments: lateral lean Rt>Lt over stance leg bil, wide base of support, lacks rotation   03/15/2023:  6 minute walk test:  887 ft   TODAY'S TREATMENT:  DATE:   03/20/2023: Seated on large physioball circles, tail wags, tucks and lifts  2x10 each Seated physioball rollouts flexion and flexion/SB Rt  and Lt x10 each Seated trunk SB over peanut with overhead reach/stretch x10 each way Seated fig 4 edge of mat table, Lt, rocking in/out x 2' Seated ball squeeze 10x5" holds with TA indraw Seated bil clam 2x10 blue loop Seated foam roller press with TA indraw 10x5" Standing sidestepping along counter blue loop at ankles x1 pass back/forth Standing bear hug over ball on elevated mat table rock side to side   03/15/2023: Seated on large physioball circles, tail wags, tucks and lifts  2x10 each Seated physioball rollouts flexion and flexion/SB Rt and Lt x10 each Seated trunk SB over peanut with overhead reach/stretch x10 each way Seated ball squeeze 10x5" holds with TA indraw Seated bil clam 2x10 blue loop Standing sidestepping along counter blue loop at ankles x3 passes back/forth 6 minutes of ambulation:  887 ft around both PT gyms   03/13/23: Seated on large physioball circles, tail wags, tucks and lifts x 10 each Seated physioball rollouts flexion and flexion/SB Rt and Lt x10 each Seated foam roller press 10 reps hold x 2 breath cycles for abdominal indraw Standing bear hug around large physioball on table rocking side to side for decompression Seated ball squeeze 10x5" holds with TA indraw Seated bil clam 2x10 blue loop Standing sidestepping along counter blue loop at ankles x3 passes back/forth Seated yellow band bil row and shoulder ext 2x10 Seated 3lb chop/reverse chop 1x10 each way Standing "L" bear hug around large physioball on low table rocking side to side for decompression Discussion of benefits of being in a pool and caution to not overdo once in water     PATIENT EDUCATION:  Education details: Access Code: [employer] Person educated: Patient Education method: Programmer, multimedia, Demonstration, and Handouts Education comprehension: verbalized understanding, unable to perform due to onset of dizziness within session   HOME EXERCISE PROGRAM: Access Code: [facility] URL:  [email or web address] Date: 01/23/2023 Prepared by: Loistine Simas Constantinos Krempasky   Exercises - Quadruped Rocking Slow  - 1 x daily - 7 x weekly - 1 sets - 10 reps - Quadruped Cat Cow  - 1 x daily - 7 x weekly - 3 sets - 10 reps - Pelvic Tilt on Swiss Ball  - 1 x daily - 7 x weekly - 1 sets - 10 reps - Pelvic Circles on Swiss Ball  - 1 x daily - 7 x weekly - 1 sets - 10 reps - Seated Lateral Trunk Stretch on Swiss Ball  - 1 x daily - 7 x weekly - 1 sets - 10 reps   ASSESSMENT:   CLINICAL IMPRESSION: Patient reports signif reduction in pain frequency and intensity.  She only experiences brief pain with sleeping but is able to change position to reduce pain.  Most common symptom is just pressure.  She has trunk and hip ROM WFL and uses stretches and change of position to stay mobile.  She is walking at least 30' most days of the week.  Pt has understanding of stages of labor and positioning options.  She has met all goals with exception of partially meeting ODI.  She is ready to d/c to [facility].    OBJECTIVE IMPAIRMENTS: Abnormal gait, decreased activity tolerance, decreased coordination, decreased endurance, decreased mobility, difficulty walking, decreased ROM, decreased strength, dizziness, impaired flexibility, improper body mechanics, postural dysfunction, obesity, and pain.    ACTIVITY LIMITATIONS: carrying, lifting, bending,  sitting, standing, squatting, sleeping, bed mobility, bathing, dressing, and locomotion level   PARTICIPATION LIMITATIONS: meal prep, cleaning, laundry, driving, shopping, community activity, and occupation   PERSONAL FACTORS: 1 comorbidity: pregnancy, dizziness  are also affecting patient's functional outcome.    REHAB POTENTIAL: Good   CLINICAL DECISION MAKING: Stable/uncomplicated   EVALUATION COMPLEXITY: Low     GOALS: Goals reviewed with patient? Yes   SHORT TERM GOALS: Target date: 02/20/23   Pt will be ind with initial HEP Baseline: Goal status:  met   2.  Pt will be educated on positioning with pillows for improved sleep. Baseline:  Goal status: met   3.  Pt will report improved tolerance of sitting to up to 20 min  Baseline: 10 min Goal status: met, can now sit 3-4 hours   4. Pt will learn how to properly activate her deep core stabilizers for improved support of pregnancy and lumbar spine.  Baseline:  Goal  status: met       LONG TERM GOALS: Target date: 03/20/23   Pt will be ind with advanced HEP to help manage symptoms through late stage pregnancy. Baseline:  Goal status: met 4/17   2.  Pt will be educated about positions for labor prep and stages of labor. Baseline:  Goal status: met 4/17   3.  Pt will report improved sleep stretches of at least 2 hours before needing to change positions  Baseline:  Goal status:met   4.  Pt will improve LE strength to at least 4+/5 for improved gait and squatting Baseline:  Goal status: met 4/17   5.  Pt will improve ODI score to 20/50 to demo reduced disability. Baseline: 25/50 Goal status: PARTIALLY MET     PLAN:   PT FREQUENCY: 1-2x/week   PT DURATION: 8 weeks   PLANNED INTERVENTIONS: Therapeutic exercises, Therapeutic activity, Neuromuscular re-education, Patient/Family education, Self Care, Joint mobilization, Aquatic Therapy, Dry Needling, Electrical stimulation, Spinal mobilization, Cryotherapy, Moist heat, Taping, and Manual therapy.   PLAN FOR NEXT SESSION:  Reassessment next week, seated, seated on physioball pressure relief, pelvic mobility, stretching, light core stabilization      PHYSICAL THERAPY DISCHARGE SUMMARY  Visits from Start of Care: 5  Current functional level related to goals / functional outcomes: Pt has met all goals and is anticipating delivery in the next few weeks.  She has tools to manage pain and understands options for stages of labor.  Painfree trunk ROM and improved LE strength measured today.      Remaining deficits: See  above   Education / Equipment: HEP   Patient agrees to discharge. Patient goals were met. Patient is being discharged due to meeting the stated rehab goals.   Morton Peters, PT 03/20/23 8:42 AM  [facility] [address], [address] [place], [place] [address] Phone # [phone] Fax [phone]

## 2023-03-25 ENCOUNTER — Other Ambulatory Visit: Payer: Medicaid Other

## 2023-03-25 ENCOUNTER — Other Ambulatory Visit: Payer: Self-pay

## 2023-03-25 ENCOUNTER — Inpatient Hospital Stay: Payer: Medicaid Other | Admitting: Anesthesiology

## 2023-03-25 ENCOUNTER — Inpatient Hospital Stay
Admission: RE | Admit: 2023-03-25 | Discharge: 2023-03-29 | DRG: 788 | Disposition: A | Discharge status: Home / Self Care | Payer: Medicaid Other | Attending: Obstetrics | Admitting: Obstetrics

## 2023-03-25 ENCOUNTER — Encounter: Payer: Self-pay | Admitting: Obstetrics and Gynecology

## 2023-03-25 ENCOUNTER — Ambulatory Visit (HOSPITAL_BASED_OUTPATIENT_CLINIC_OR_DEPARTMENT_OTHER): Payer: Medicaid Other

## 2023-03-25 DIAGNOSIS — Z7982 Long term (current) use of aspirin: Secondary | ICD-10-CM | POA: Diagnosis not present

## 2023-03-25 DIAGNOSIS — O9902 Anemia complicating childbirth: Secondary | ICD-10-CM | POA: Diagnosis present

## 2023-03-25 DIAGNOSIS — O99214 Obesity complicating childbirth: Secondary | ICD-10-CM | POA: Diagnosis present

## 2023-03-25 DIAGNOSIS — O24410 Gestational diabetes mellitus in pregnancy, diet controlled: Secondary | ICD-10-CM | POA: Insufficient documentation

## 2023-03-25 DIAGNOSIS — O3433 Maternal care for cervical incompetence, third trimester: Secondary | ICD-10-CM | POA: Insufficient documentation

## 2023-03-25 DIAGNOSIS — O4103X0 Oligohydramnios, third trimester, not applicable or unspecified: Secondary | ICD-10-CM | POA: Insufficient documentation

## 2023-03-25 DIAGNOSIS — O09293 Supervision of pregnancy with other poor reproductive or obstetric history, third trimester: Secondary | ICD-10-CM | POA: Insufficient documentation

## 2023-03-25 DIAGNOSIS — Z3A37 37 weeks gestation of pregnancy: Secondary | ICD-10-CM

## 2023-03-25 DIAGNOSIS — O99213 Obesity complicating pregnancy, third trimester: Secondary | ICD-10-CM | POA: Insufficient documentation

## 2023-03-25 DIAGNOSIS — Z87891 Personal history of nicotine dependence: Secondary | ICD-10-CM

## 2023-03-25 DIAGNOSIS — O24420 Gestational diabetes mellitus in childbirth, diet controlled: Secondary | ICD-10-CM | POA: Diagnosis present

## 2023-03-25 DIAGNOSIS — O4100X0 Oligohydramnios, unspecified trimester, not applicable or unspecified: Principal | ICD-10-CM | POA: Diagnosis present

## 2023-03-25 DIAGNOSIS — O09213 Supervision of pregnancy with history of pre-term labor, third trimester: Secondary | ICD-10-CM

## 2023-03-25 LAB — CBC
HCT: 33.1 % — ABNORMAL LOW (ref 36.0–46.0)
Hemoglobin: 11.4 g/dL — ABNORMAL LOW (ref 12.0–15.0)
MCH: 30.2 pg (ref 26.0–34.0)
MCHC: 34.4 g/dL (ref 30.0–36.0)
MCV: 87.8 fL (ref 80.0–100.0)
Platelets: 290 10*3/uL (ref 150–400)
RBC: 3.77 MIL/uL — ABNORMAL LOW (ref 3.87–5.11)
RDW: 12.5 % (ref 11.5–15.5)
WBC: 8 10*3/uL (ref 4.0–10.5)
nRBC: 0 % (ref 0.0–0.2)

## 2023-03-25 LAB — GLUCOSE, CAPILLARY
Glucose-Capillary: 82 mg/dL (ref 70–99)
Glucose-Capillary: 80 mg/dL (ref 70–99)

## 2023-03-25 LAB — TYPE AND SCREEN
ABO/RH(D): A POS
Antibody Screen: NEGATIVE

## 2023-03-25 MED ORDER — LIDOCAINE HCL (PF) 1 % IJ SOLN
INTRAMUSCULAR | Status: DC | PRN
  Administered 2023-03-25: 3 mL via SUBCUTANEOUS

## 2023-03-25 MED ORDER — OXYTOCIN-SODIUM CHLORIDE 30-0.9 UT/500ML-% IV SOLN
2.5000 [IU]/h | INTRAVENOUS | Status: DC
  Filled 2023-03-25 (×2): qty 500

## 2023-03-25 MED ORDER — AMMONIA AROMATIC IN INHA
RESPIRATORY_TRACT | Status: AC
  Filled 2023-03-25: qty 10

## 2023-03-25 MED ORDER — EPHEDRINE 5 MG/ML INJ: 10.0000 mg | INTRAVENOUS | Status: DC | PRN

## 2023-03-25 MED ORDER — LACTATED RINGERS IV SOLN: INTRAVENOUS | Status: DC

## 2023-03-25 MED ORDER — ONDANSETRON HCL 4 MG/2ML IJ SOLN
4.0000 mg | Freq: Four times a day (QID) | INTRAMUSCULAR | Status: DC | PRN
  Administered 2023-03-25 – 2023-03-26 (×2): 4 mg via INTRAVENOUS
  Filled 2023-03-25 (×2): qty 2

## 2023-03-25 MED ORDER — LIDOCAINE-EPINEPHRINE (PF) 1.5 %-1:200000 IJ SOLN
INTRAMUSCULAR | Status: DC | PRN
  Administered 2023-03-25: 3 mL via EPIDURAL

## 2023-03-25 MED ORDER — OXYTOCIN BOLUS FROM INFUSION: 333.0000 mL | Freq: Once | INTRAVENOUS | Status: DC

## 2023-03-25 MED ORDER — SOD CITRATE-CITRIC ACID 500-334 MG/5ML PO SOLN: 30.0000 mL | ORAL | Status: DC | PRN

## 2023-03-25 MED ORDER — EPHEDRINE 5 MG/ML INJ
10.0000 mg | INTRAVENOUS | Status: DC | PRN
  Filled 2023-03-25: qty 5

## 2023-03-25 MED ORDER — OXYTOCIN 10 UNIT/ML IJ SOLN
INTRAMUSCULAR | Status: AC
  Filled 2023-03-25: qty 2

## 2023-03-25 MED ORDER — FENTANYL CITRATE (PF) 100 MCG/2ML IJ SOLN
50.0000 ug | INTRAMUSCULAR | Status: DC | PRN
  Administered 2023-03-25: 100 ug via INTRAVENOUS
  Filled 2023-03-25 (×2): qty 2

## 2023-03-25 MED ORDER — ACETAMINOPHEN 325 MG PO TABS
650.0000 mg | ORAL_TABLET | ORAL | Status: DC | PRN
  Administered 2023-03-25: 650 mg via ORAL
  Filled 2023-03-25: qty 2

## 2023-03-25 MED ORDER — MISOPROSTOL 25 MCG QUARTER TABLET
25.0000 ug | ORAL_TABLET | Freq: Once | ORAL | Status: AC
  Administered 2023-03-25: 25 ug via ORAL
  Filled 2023-03-25: qty 1

## 2023-03-25 MED ORDER — LACTATED RINGERS IV SOLN
500.0000 mL | Freq: Once | INTRAVENOUS | Status: AC
  Administered 2023-03-25: 500 mL via INTRAVENOUS

## 2023-03-25 MED ORDER — DIPHENHYDRAMINE HCL 50 MG/ML IJ SOLN: 12.5000 mg | INTRAMUSCULAR | Status: DC | PRN

## 2023-03-25 MED ORDER — FENTANYL-BUPIVACAINE-NACL 0.5-0.125-0.9 MG/250ML-% EP SOLN
12.0000 mL/h | EPIDURAL | Status: DC | PRN
  Administered 2023-03-25: 12 mL/h via EPIDURAL
  Filled 2023-03-25: qty 250

## 2023-03-25 MED ORDER — MISOPROSTOL 200 MCG PO TABS
ORAL_TABLET | ORAL | Status: AC
  Filled 2023-03-25: qty 4

## 2023-03-25 MED ORDER — PHENYLEPHRINE 80 MCG/ML (10ML) SYRINGE FOR IV PUSH (FOR BLOOD PRESSURE SUPPORT): 80.0000 ug | PREFILLED_SYRINGE | INTRAVENOUS | Status: DC | PRN

## 2023-03-25 MED ORDER — LIDOCAINE HCL (PF) 1 % IJ SOLN
30.0000 mL | INTRAMUSCULAR | Status: DC | PRN
  Filled 2023-03-25: qty 30

## 2023-03-25 MED ORDER — LACTATED RINGERS IV SOLN: 500.0000 mL | INTRAVENOUS | Status: DC | PRN

## 2023-03-25 MED ORDER — OXYTOCIN-SODIUM CHLORIDE 30-0.9 UT/500ML-% IV SOLN
1.0000 m[IU]/min | INTRAVENOUS | Status: DC
  Administered 2023-03-25: 2 m[IU]/min via INTRAVENOUS

## 2023-03-25 MED ORDER — MISOPROSTOL 25 MCG QUARTER TABLET
25.0000 ug | ORAL_TABLET | Freq: Once | ORAL | Status: AC
  Administered 2023-03-25: 25 ug via VAGINAL
  Filled 2023-03-25: qty 1

## 2023-03-25 MED ORDER — SODIUM CHLORIDE 0.9 % IV SOLN
INTRAVENOUS | Status: DC | PRN
  Administered 2023-03-25: 9 mL via EPIDURAL

## 2023-03-25 MED ORDER — TERBUTALINE SULFATE 1 MG/ML IJ SOLN
0.2500 mg | Freq: Once | INTRAMUSCULAR | Status: DC | PRN
  Administered 2023-03-25: 0.25 mg via SUBCUTANEOUS
  Filled 2023-03-25: qty 1

## 2023-03-25 NOTE — H&P (Signed)
OB History & Physical   History of Present Illness:  Chief Complaint:   HPI:  Catherine Patterson is a 32 y.o. G39P0100 female at [redacted]w[redacted]d dated by LMP.  She presents to L&D after being sent over from MFM for oligohydramnios and BPP 6/8. She had a 2cm pocket of fluid, but AFI was 3.18cm. BPP 6/8 from MFM, no fetal tone noted.   Pregnancy Issues: 1. Preterm delivery @ 22wks, baby lived . Cerclage placed and removed at 36wks 2. Obesity, NOB BMI 48, current BMI 55 3. 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 GDM, diet controlled   Maternal Medical History:   Past Medical History:  Diagnosis Date   ADHD (attention deficit hyperactivity disorder)    Depression    suicide attempt  in 2009   Gestational diabetes    Headache(784.0)    Impingement syndrome of shoulder 02/04/2012   Menorrhagia    MENORRHAGIA 06/29/2010   Qualifier: Diagnosis of  By: Dayton Martes MD, Talia     Preterm labor in second trimester with preterm delivery 09/19/2020   Preterm premature rupture of membranes (PPROM) with onset of labor after 24 hours of rupture in second trimester, antepartum 09/18/2020   Sciatica     Past Surgical History:  Procedure Laterality Date   CERVICAL CERCLAGE N/A 12/10/2022   Procedure: CERCLAGE CERVICAL;  Surgeon: Suzy Bouchard, MD;  Location: [facility] ORS;  Service: Gynecology;  Laterality: N/A;   NO PAST SURGERIES      Allergies  Allergen Reactions   Cherry Itching and Swelling    Throat swells    Prior to Admission medications   Medication Sig Start Date End Date Taking? Authorizing Provider  acetaminophen (TYLENOL) 500 MG tablet Take 1,500 mg by mouth every 6 (six) hours as needed.   Yes [provider]  aspirin EC 81 MG tablet Take 81 mg by mouth daily. Swallow whole.   Yes [provider]  calcium carbonate (TUMS - DOSED IN MG ELEMENTAL CALCIUM) 500 MG chewable tablet Chew 1 tablet by mouth daily.   Yes [provider]   cyclobenzaprine (FLEXERIL) 10 MG tablet Take 1 tablet by mouth 2 (two) times daily as needed. 12/18/22  Yes [provider]  Prenatal Vit-Fe Fumarate-FA (PRENATAL MULTIVITAMIN) TABS tablet Take 1 tablet by mouth daily at 12 noon.   Yes [provider]  promethazine (PHENERGAN) 12.5 MG tablet Take 1 tablet by mouth every 6 (six) hours as needed. 10/07/22  Yes [provider]  VENTOLIN HFA 108 (90 Base) MCG/ACT inhaler Inhale 2 puffs into the lungs every 6 (six) hours as needed. 11/29/22  Yes [provider]  progesterone 200 MG SUPP Place 200 mg vaginally at bedtime. Patient not taking: Reported on 03/25/2023    [provider]    Prenatal care site: [facility]   Social History: She  reports that she quit smoking about 7 years ago. Her smoking use included cigarettes. She smoked an average of .15 packs per day. She has never used smokeless tobacco. She reports that she does not drink alcohol and does not use drugs.  Family History: family history includes Diabetes in her maternal grandfather.   Review of Systems: A full review of systems was performed and negative except as noted in the HPI.     Physical Exam:  Vital Signs: BP 131/76 (BP Location: Right Arm)   Pulse (!) 107   Temp 99 F (37.2 C) (Oral)   Resp 18   Ht 5'  8" (1.727 m)   Wt (!) 165.6 kg   LMP 07/09/2022   BMI 55.51 kg/m  General: no acute distress.  HEENT: normocephalic, atraumatic Heart: regular rate & rhythm.  No murmurs/rubs/gallops Lungs: clear to auscultation bilaterally, normal respiratory effort Abdomen: soft, gravid, non-tender;  EFW: 6.5lb Pelvic:   External: Normal external female genitalia  Cervix: Dilation: 2 / Effacement (%): Thick / Station: -2    Extremities: non-tender, symmetric, mild edema bilaterally.  DTRs: +2  Neurologic: Alert & oriented x 3.    No results found for this or any previous visit (from the past 24 hour(s)).  Pertinent  Results:  Prenatal Labs: Blood type/Rh A pos  Antibody screen neg  Rubella Immune  Varicella Immune  RPR NR  HBsAg Neg  HIV NR  GC neg  Chlamydia neg  Genetic screening negative  1 hour GTT 147  3 hour GTT 103, 193, 162, 79  GBS Negative   FHT: 145bpm, moderate variability, accelerations present, no decelerations TOCO: contractions rare SVE:  Dilation: 2 / Effacement (%): Thick / Station: -2    Cephalic by [employer]  [place] MFM FETAL BPP WO NON STRESS  Result Date: 03/25/2023 ----------------------------------------------------------------------  OBSTETRICS REPORT                       (Signed Final 03/25/2023 11:14 am) ---------------------------------------------------------------------- Patient Info  ID #:       [id number]                          D.O.B.:  05/14/1991 (31 yrs)  Name:       Catherine Patterson-                Visit Date: 03/25/2023 09:32 am              [name] ---------------------------------------------------------------------- Performed By  Attending:        Noralee Space MD        Ref. Address:     [facility]  Performed By:     Eden Lathe BS      [facility]                    RDMS RVT                                 Fetal Care at                                                             [facility]  Referred By:      Janyce Llanos CNM ---------------------------------------------------------------------- Orders  #  Description                           Code        Ordered By  1  [place] MFM FETAL BPP WO NON               76819.01    RAVI [facility]  STRESS ----------------------------------------------------------------------  #  Order #                     Accession #                Episode #  1  [id number]                   [id number]                 [id number] ---------------------------------------------------------------------- Indications  Gestational diabetes in pregnancy, diet        O24.410  controlled  Obesity  complicating pregnancy, third          O99.213  trimester  Cervical cerclage suture present, third        O34.33  trimester  Poor obstetric history: Previous midtrimester  O09.299  loss  Poor obstetric history: Previous preterm       O09.219  delivery, antepartum  Encounter for other antenatal screening        Z36.2  follow-up  [redacted] weeks gestation of pregnancy                Z3A.37 ---------------------------------------------------------------------- Fetal Evaluation  Num Of Fetuses:         1  Fetal Heart Rate(bpm):  154  Cardiac Activity:       Observed  Presentation:           Cephalic  Placenta:               Anterior  P. Cord Insertion:      Previously visualized  Amniotic Fluid  AFI FV:      Oligohydramnios  AFI Sum(cm)     %Tile       Largest Pocket(cm)  3.18            < 3         2  RUQ(cm)                     LUQ(cm)  2                           1.18 ---------------------------------------------------------------------- Biophysical Evaluation  Amniotic F.V:   Pocket => 2 cm             F. Tone:        Not Observed  F. Movement:    Observed                   Score:          6/8  F. Breathing:   Observed ---------------------------------------------------------------------- OB History  Gravidity:    2         Term:   0        Prem:   1        SAB:   0  TOP:          0       Ectopic:  0        Living: 0 ---------------------------------------------------------------------- Gestational Age  Best:          37w 0d     Det. ByMarcella Dubs         EDD:   04/15/23                                      (  09/26/22) ---------------------------------------------------------------------- Anatomy  Stomach:               Appears normal, left   Bladder:                Appears normal                         sided  Kidneys:               Appear normal ---------------------------------------------------------------------- Cervix Uterus Adnexa  Cervix  Not visualized (advanced GA >24wks)  ---------------------------------------------------------------------- Impression  Gestational diabetes.  Reportedly well-controlled on diet.  Class III obesity.  Patient had prophylactic cerclage in this pregnancy that was  removed.  On today's ultrasound, oligohydramnios is seen (AFI 3 cm).  Maximum vertical pocket is 2 cm.  Fetal tone was not  observed.  BPP 6/8.  Cephalic presentation.  I explained the findings and recommended delivery.  Patient was sent over to labor and delivery.  I discussed with Ms. Rubye Oaks, CNM. ---------------------------------------------------------------------- Recommendations  -Consider non-urgent delivery now.  -If patient wants expectant management, NST to be  performed at your office in 2 to 3 days, and BPP at our office  next week. ----------------------------------------------------------------------                 Noralee Space, MD Electronically Signed Final Report   03/25/2023 11:14 am ----------------------------------------------------------------------  [place] MFM FETAL BPP WO NON STRESS  Result Date: 03/18/2023 ----------------------------------------------------------------------  OBSTETRICS REPORT                       (Signed Final 03/18/2023 10:50 am) ---------------------------------------------------------------------- Patient Info  ID #:       [id number]                          D.O.B.:  July 31, 1991 (31 yrs)  Name:       Catherine Patterson-                Visit Date: 03/18/2023 09:40 am              [name] ---------------------------------------------------------------------- Performed By  Attending:        Braxton Feathers DO       Ref. Address:     [facility]  Performed By:     Neita Carp RDMS        Location[facility]         [facility]                                                             Fetal Care at                                                             [facility]  Referred By:      Janyce Llanos CNM  ---------------------------------------------------------------------- Orders  #  Description  Code        Ordered By  1  [place] MFM FETAL BPP WO NON               [id number]    YU FANG     STRESS ----------------------------------------------------------------------  #  Order #                     Accession #                Episode #  1  [id number]                   [id number]                 [id number] ---------------------------------------------------------------------- Indications  [redacted] weeks gestation of pregnancy                Z3A.36  Gestational diabetes in pregnancy, diet        O24.410  controlled  Obesity complicating pregnancy, third          O99.213  trimester  Cervical cerclage suture present, third        O34.33  trimester  Poor obstetric history: Previous midtrimester  O09.299  loss (add cerv insuff)  Poor obstetric history: Previous preterm       O09.219  delivery, antepartum ---------------------------------------------------------------------- Fetal Evaluation  Num Of Fetuses:         1  Fetal Heart Rate(bpm):  159  Cardiac Activity:       Observed  Presentation:           Cephalic  Placenta:               Anterior  P. Cord Insertion:      Previously visualized  AFI Sum(cm)     %Tile       Largest Pocket(cm)  8.07            8           5.2  RUQ(cm)       RLQ(cm)       LUQ(cm)        LLQ(cm)  5.2           1.89          0              0.98 ---------------------------------------------------------------------- Biophysical Evaluation  Amniotic F.V:   Within normal limits       F. Tone:        Observed  F. Movement:    Observed                   Score:          8/8  F. Breathing:   Observed ---------------------------------------------------------------------- OB History  Gravidity:    2         Term:   0        Prem:   1        SAB:   0  TOP:          0       Ectopic:  0        Living: 0 ---------------------------------------------------------------------- Gestational Age  Best:           36w 0d     Det. ByMarcella Dubs         EDD:   04/15/23                                      (  09/26/22) ---------------------------------------------------------------------- Comments  The patient is here for a BPP for gDM. She is at [employer]. EDD  of 04/15/2023 dated by: Early [facility]  (09/26/22). She  denies LOF and is concerned that the fluid level is trending  down.  Sonographic findings  Single intrauterine pregnancy.  Fetal cardiac activity: Observed.  Presentation: Cephalic.  Interval fetal anatomy appears normal.  Amniotic fluid volume: . AFI: 8.07 cm.  MVP: 5.2 cm.  Placenta: Anterior.  BPP: 8/8.  Recommendations  - The patient will discuss to see if her clinic can perform the  needed tests to r/o ROM when they remove her cerclage. If  not, then she can go to the hospital for r/o ROM given the  drop in amniotic fluid.  - Return later this week to have another BPP and fluid check.  - Delivery timing pending clinical course. ----------------------------------------------------------------------                  Braxton Feathers, DO Electronically Signed Final Report   03/18/2023 10:50 am ----------------------------------------------------------------------  [place] MFM OB FOLLOW UP  Result Date: 03/04/2023 ----------------------------------------------------------------------  OBSTETRICS REPORT                       (Signed Final 03/04/2023 09:39 am) ---------------------------------------------------------------------- Patient Info  ID #:       [id number]                          D.O.B.:  July 07, 1991 (31 yrs)  Name:       Catherine Patterson-                Visit Date: 03/04/2023 08:09 am              [name] ---------------------------------------------------------------------- Performed By  Attending:        Noralee Space MD        Ref. Address:     [facility]  Performed By:     Eden Lathe BS      Location[facility]         [facility]                    RDMS RVT                                 Fetal  Care at                                                             [facility]  Referred By:      Janyce Llanos CNM ---------------------------------------------------------------------- Orders  #  Description                           Code        Ordered By  1  [place] MFM OB FOLLOW UP                   40981.19    RAVI SHANKAR  2  [place] MFM FETAL BPP  81191.4     RAVI [facility]     W/NONSTRESS ----------------------------------------------------------------------  #  Order #                     Accession #                Episode #  1  [id number]                   [id number]                 [id number]  2  [id number]                   [id number]                 [id number] ---------------------------------------------------------------------- Indications  Gestational diabetes in pregnancy, diet        O24.410  controlled  Obesity complicating pregnancy, third          O99.213  trimester (pregravid BMI 48)  Cervical insufficiency,3rd (cerclage in place) O34.33  Poor obstetric history: Previous midtrimester  O09.299  loss (22 weeks)  Poor obstetric history: Previous preterm       O09.219  delivery, antepartum (22 weeks)  LR-NIPS per pt  [redacted] weeks gestation of pregnancy                Z3A.34 ---------------------------------------------------------------------- Fetal Evaluation  Num Of Fetuses:         1  Fetal Heart Rate(bpm):  154  Cardiac Activity:       Observed  Presentation:           Cephalic  Placenta:               Anterior  P. Cord Insertion:      Previously visualized  Amniotic Fluid  AFI FV:      Oligohydramnios  AFI Sum(cm)     %Tile       Largest Pocket(cm)  4.02            < 3         2.1  RUQ(cm)                     LUQ(cm)  1.92                        2.1 ---------------------------------------------------------------------- Biophysical Evaluation  Amniotic F.V:   Pocket => 2 cm             F. Tone:        Observed  F. Movement:    Observed                   N.S.T:           Reactive  F. Breathing:   Observed                   Score:          10/10 ---------------------------------------------------------------------- Biometry  BPD:        87  mm     G. Age:  35w 1d         79  %    CI:        76.19   %    70 - 86  FL/HC:      19.8   %    19.4 - 21.8  HC:    315.86   mm     G. Age:  35w 3d         52  %    HC/AC:      1.02        0.96 - 1.11  AC:    309.94   mm     G. Age:  35w 0d         79  %    FL/BPD:     71.9   %    71 - 87  FL:      62.55  mm     G. Age:  32w 3d          8  %    FL/AC:      20.2   %    20 - 24  LV:        2.4  mm  Est. FW:    2401  gm      5 lb 5 oz     53  % ---------------------------------------------------------------------- OB History  Gravidity:    2         Term:   0        Prem:   1        SAB:   0  TOP:          0       Ectopic:  0        Living: 0 ---------------------------------------------------------------------- Gestational Age  U/S Today:     34w 4d                                        EDD:   04/11/23  Best:          34w 0d     Det. By:  Marcella Dubs         EDD:   04/15/23                                      (09/26/22) ---------------------------------------------------------------------- Anatomy  Cranium:               Appears normal         Aortic Arch:            Previously seen  Cavum:                 Appears normal         Ductal Arch:            Appears normal  Ventricles:            Appears normal         Diaphragm:              Previously seen  Choroid Plexus:        Previously seen        Stomach:                Appears normal, left  sided  Cerebellum:            Previously seen        Abdomen:                Appears normal  Posterior Fossa:       Appears normal         Abdominal Wall:         Previously seen  Nuchal Fold:           Previously seen        Cord Vessels:           Previously seen  Face:                   Orbits and profile     Kidneys:                Appear normal                         previously seen  Lips:                  Previously seen        Bladder:                Appears normal  Thoracic:              Appears normal         Spine:                  Previously seen  Heart:                 Previously seen        Upper Extremities:      Previously seen  RVOT:                  Appears normal         Lower Extremities:      Previously seen  LVOT:                  Appears normal  Other:  Bicav view seen 3VV, 3VTV,  Heels/feet and open hands/5th digits,          Nasal bone, lenses, maxilla, mandible, falx previously visualized          Female gender previously seen. ---------------------------------------------------------------------- Doppler - Fetal Vessels  Umbilical Artery   S/D     %tile      RI    %tile      PI    %tile     PSV    ADFV    RDFV                                                     (cm/s)   3.33       88     0.7       90    1.23       96    31.44      No      No ---------------------------------------------------------------------- Cervix Uterus Adnexa  Cervix  Not visualized (advanced GA >24wks)  Uterus  No abnormality visualized.  Right Ovary  Not visualized.  Left Ovary  Not visualized.  Cul De Sac  No free fluid seen.  Adnexa  No abnormality visualized ---------------------------------------------------------------------- Impression  Gestational diabetes.  Reportedly well-controlled on diet.  Prepregnancy BMI of 48.  Cervical insufficiency.  Patient had rescue cerclage in this  pregnancy.  She takes vaginal progesterone daily.  On today's ultrasound, amniotic fluid is decreased and the  maximum vertical pocket is 2 cm.  Cephalic presentation.  Fetal growth is appropriate for gestational age.  Antenatal  testing is reassuring.  NST is reactive.  BPP 10/10.  I explained the finding of low amniotic fluid (no  oligohydramnios based on maximum vertical pocket  measurement).   Patient reports good fetal movements.  She  does not give history of leakage of amniotic fluid.  I  encouraged her to increase hydration. ---------------------------------------------------------------------- Recommendations  - Patient has an appointment at your office on 03/06/2023.  I  recommend NST and amniotic fluid check at her prenatal visit.  -BPP next week. ----------------------------------------------------------------------                 Noralee Space, MD Electronically Signed Final Report   03/04/2023 09:39 am ----------------------------------------------------------------------  [place] MFM FETAL BPP W/NONSTRESS  Result Date: 03/04/2023 ----------------------------------------------------------------------  OBSTETRICS REPORT                       (Signed Final 03/04/2023 09:39 am) ---------------------------------------------------------------------- Patient Info  ID #:       [id number]                          D.O.B.:  January 11, 1991 (31 yrs)  Name:       Catherine Patterson-                Visit Date: 03/04/2023 08:09 am              [name] ---------------------------------------------------------------------- Performed By  Attending:        Noralee Space MD        Ref. Address:     [facility]  Performed By:     Eden Lathe BS      Location[facility]         [facility]                    RDMS RVT                                 Fetal Care at                                                             [facility]  Referred By:      Janyce Llanos CNM ---------------------------------------------------------------------- Orders  #  Description                           Code        Ordered By  1  [place] MFM OB FOLLOW UP                   86578.46    RAVI SHANKAR  2  [place] MFM FETAL BPP  16109.6     RAVI [facility]     W/NONSTRESS ----------------------------------------------------------------------  #  Order #                     Accession #                Episode #   1  [id number]                   [id number]                 [id number]  2  [id number]                   [id number]                 [id number] ---------------------------------------------------------------------- Indications  Gestational diabetes in pregnancy, diet        O24.410  controlled  Obesity complicating pregnancy, third          O99.213  trimester (pregravid BMI 48)  Cervical insufficiency,3rd (cerclage in place) O34.33  Poor obstetric history: Previous midtrimester  O09.299  loss (22 weeks)  Poor obstetric history: Previous preterm       O09.219  delivery, antepartum (22 weeks)  LR-NIPS per pt  [redacted] weeks gestation of pregnancy                Z3A.34 ---------------------------------------------------------------------- Fetal Evaluation  Num Of Fetuses:         1  Fetal Heart Rate(bpm):  154  Cardiac Activity:       Observed  Presentation:           Cephalic  Placenta:               Anterior  P. Cord Insertion:      Previously visualized  Amniotic Fluid  AFI FV:      Oligohydramnios  AFI Sum(cm)     %Tile       Largest Pocket(cm)  4.02            < 3         2.1  RUQ(cm)                     LUQ(cm)  1.92                        2.1 ---------------------------------------------------------------------- Biophysical Evaluation  Amniotic F.V:   Pocket => 2 cm             F. Tone:        Observed  F. Movement:    Observed                   N.S.T:          Reactive  F. Breathing:   Observed                   Score:          10/10 ---------------------------------------------------------------------- Biometry  BPD:        87  mm     G. Age:  35w 1d         79  %    CI:        76.19   %    70 - 86  FL/HC:      19.8   %    19.4 - 21.8  HC:    315.86   mm     G. Age:  35w 3d         52  %    HC/AC:      1.02        0.96 - 1.11  AC:    309.94   mm     G. Age:  35w 0d         79  %    FL/BPD:     71.9   %    71 - 87  FL:      62.55  mm     G. Age:  32w 3d          8  %     FL/AC:      20.2   %    20 - 24  LV:        2.4  mm  Est. FW:    2401  gm      5 lb 5 oz     53  % ---------------------------------------------------------------------- OB History  Gravidity:    2         Term:   0        Prem:   1        SAB:   0  TOP:          0       Ectopic:  0        Living: 0 ---------------------------------------------------------------------- Gestational Age  U/S Today:     34w 4d                                        EDD:   04/11/23  Best:          34w 0d     Det. By:  Marcella Dubs         EDD:   04/15/23                                      (09/26/22) ---------------------------------------------------------------------- Anatomy  Cranium:               Appears normal         Aortic Arch:            Previously seen  Cavum:                 Appears normal         Ductal Arch:            Appears normal  Ventricles:            Appears normal         Diaphragm:              Previously seen  Choroid Plexus:        Previously seen        Stomach:                Appears normal, left  sided  Cerebellum:            Previously seen        Abdomen:                Appears normal  Posterior Fossa:       Appears normal         Abdominal Wall:         Previously seen  Nuchal Fold:           Previously seen        Cord Vessels:           Previously seen  Face:                  Orbits and profile     Kidneys:                Appear normal                         previously seen  Lips:                  Previously seen        Bladder:                Appears normal  Thoracic:              Appears normal         Spine:                  Previously seen  Heart:                 Previously seen        Upper Extremities:      Previously seen  RVOT:                  Appears normal         Lower Extremities:      Previously seen  LVOT:                  Appears normal  Other:  Bicav view seen 3VV, 3VTV,  Heels/feet and open hands/5th digits,           Nasal bone, lenses, maxilla, mandible, falx previously visualized          Female gender previously seen. ---------------------------------------------------------------------- Doppler - Fetal Vessels  Umbilical Artery   S/D     %tile      RI    %tile      PI    %tile     PSV    ADFV    RDFV                                                     (cm/s)   3.33       88     0.7       90    1.23       96    31.44      No      No ---------------------------------------------------------------------- Cervix Uterus Adnexa  Cervix  Not visualized (advanced GA >24wks)  Uterus  No abnormality visualized.  Right Ovary  Not visualized.  Left Ovary  Not visualized.  Cul De Sac  No free fluid seen.  Adnexa  No abnormality visualized ---------------------------------------------------------------------- Impression  Gestational diabetes.  Reportedly well-controlled on diet.  Prepregnancy BMI of 48.  Cervical insufficiency.  Patient had rescue cerclage in this  pregnancy.  She takes vaginal progesterone daily.  On today's ultrasound, amniotic fluid is decreased and the  maximum vertical pocket is 2 cm.  Cephalic presentation.  Fetal growth is appropriate for gestational age.  Antenatal  testing is reassuring.  NST is reactive.  BPP 10/10.  I explained the finding of low amniotic fluid (no  oligohydramnios based on maximum vertical pocket  measurement).  Patient reports good fetal movements.  She  does not give history of leakage of amniotic fluid.  I  encouraged her to increase hydration. ---------------------------------------------------------------------- Recommendations  - Patient has an appointment at your office on 03/06/2023.  I  recommend NST and amniotic fluid check at her prenatal visit.  -BPP next week. ----------------------------------------------------------------------                 Noralee Space, MD Electronically Signed Final Report   03/04/2023 09:39 am  ----------------------------------------------------------------------   Assessment:  Vida Rigger is a 32 y.o. G43P0100 female at [redacted]w[redacted]d with IOL for oligohydramnios, BPP 6/8.   Plan:  1. Admit to Labor & Delivery; consents reviewed and obtained - Dr. Jean Rosenthal notified  2. Fetal Well being  - Fetal Tracing: Category I tracing - BPP 8/10 d/t reactive NST - Group B Streptococcus ppx indicated: n/a, GBS negative - Presentation: vertex confirmed by [place]    3. Routine OB: - Prenatal labs reviewed, as above - Rh positive - CBC, T&S, RPR on admit - Clear fluids, saline lock  4. Induction of Labor -  Contractions rare, external toco in place -  Pelvis proven to 405g -  Plan for induction with cytotec , pitocin, and AROM -  Plan for continuous fetal monitoring  -  Maternal pain control as desired; requesting unmedicated, but is open to epidural if uncomfortable - Anticipate vaginal delivery  5. Post Partum Planning: - Infant feeding: breastfeeding - Contraception: TBD  Janyce Llanos, CNM 03/25/23 11:29 AM

## 2023-03-25 NOTE — OB Triage Note (Signed)
Pt arrived from MFM.  BPP 6/8 and AFI 4. Pt sent to BP for monitoring and considering delivery.  Monitors applied, will update Provider.

## 2023-03-25 NOTE — Progress Notes (Signed)
Labor Progress Note  Catherine Patterson is a 32 y.o. G2P0100 at [redacted]w[redacted]d by LMP admitted for induction of labor due to oligohydramnios, BPP 6/8.  Subjective: called by RN to come to bedside for fetal heart rate decelerations  Objective: BP (!) 129/91   Pulse (!) 112   Temp 98 F (36.7 C) (Oral)   Resp 18   Ht  (1.727 m)   Wt (!) 165.6 kg   LMP 07/09/2022   SpO2 98%   BMI 55.51 kg/m  Notable VS details: reviewed  Fetal Assessment: FHT:  FHR: 165 bpm, variability: minimal ,  accelerations:  Present,  decelerations:  Present recurrent late decelerations Category/reactivity:  Category II UC:   regular, every 3-5 minutes SVE:    Dilation: 6cm  Effacement: 90%  Station:  -1  Consistency: soft  Position: middle  Membrane status:AROM @ 1707 Amniotic color: clear  Labs: Lab Results  Component Value Date   WBC 8.0 03/25/2023   HGB 11.4 (L) 03/25/2023   HCT 33.1 (L) 03/25/2023   MCV 87.8 03/25/2023   PLT 290 03/25/2023    Assessment / Plan: 32 year old G2P0100 at [redacted]w[redacted]d here for IOL for oligohydramnios and BPP 6/8.  Labor:  Called by RN at 2330 to come evaluate fetal heart rate tracing for fetal tachycardia. Came to bedside and patient being repositioned. Minimal cervical change over the last 3 hours. Fetal heart rate decelerations improving with position changes. Notified Dr. Jean Rosenthal of fetal heart rate decelerations with improvement. Will restart pitocin once fetal heart rate is reassuring.  Preeclampsia:  labs stable Fetal Wellbeing:  Category II, pitocin turned off, patient repositioned from left to right. IV bolus started. Terbutaline withheld since fetal heart rate decelerations improving. Dr. Jean Rosenthal notified.  Pain Control:  Epidural I/D:   GBS negative, AROM x 7hrs, no fever but patient states she feels warm. Tylenol given. Anticipated MOD:  NSVD  Janyce Llanos, CNM 03/25/2023, 11:43 PM

## 2023-03-25 NOTE — Anesthesia Preprocedure Evaluation (Addendum)
Anesthesia Evaluation  Patient identified by MRN, date of birth, ID band Patient awake    Reviewed: Allergy & Precautions, NPO status , Patient's Chart, lab work & pertinent test results  History of Anesthesia Complications Negative for: history of anesthetic complications  Airway Mallampati: IV  TM Distance: >3 FB Neck ROM: Full    Dental no notable dental hx. (+) Teeth Intact   Pulmonary neg pulmonary ROS, neg sleep apnea, neg COPD, Patient abstained from smoking.Not current smoker, former smoker   Pulmonary exam normal breath sounds clear to auscultation       Cardiovascular Exercise Tolerance: Good METS(-) hypertension(-) CAD and (-) Past MI negative cardio ROS (-) dysrhythmias  Rhythm:Regular Rate:Normal - Systolic murmurs    Neuro/Psych  Headaches PSYCHIATRIC DISORDERS  Depression       GI/Hepatic ,neg GERD  ,,(+)     (-) substance abuse    Endo/Other  diabetes, Gestational  Morbid obesity  Renal/GU negative Renal ROS     Musculoskeletal   Abdominal  (+) + obese  Peds  Hematology  (+) Blood dyscrasia, anemia Denies bleeding diatheses   Anesthesia Other Findings Past Medical History: No date: ADHD (attention deficit hyperactivity disorder) No date: Depression     Comment:  suicide attempt  in 2009 No date: Gestational diabetes No date: Headache(784.0) 02/04/2012: Impingement syndrome of shoulder No date: Menorrhagia 06/29/2010: MENORRHAGIA     Comment:  Qualifier: Diagnosis of  By: Dayton Martes MD, Talia   09/19/2020: Preterm labor in second trimester with preterm delivery 09/18/2020: Preterm premature rupture of membranes (PPROM) with onset  of labor after 24 hours of rupture in second trimester, antepartum No date: Sciatica  Reproductive/Obstetrics                              Anesthesia Physical Anesthesia Plan  ASA: 3  Anesthesia Plan: Epidural   Post-op Pain Management:     Induction:   PONV Risk Score and Plan: 2 and Treatment may vary due to age or medical condition and Ondansetron  Airway Management Planned: Natural Airway  Additional Equipment:   Intra-op Plan:   Post-operative Plan:   Informed Consent: I have reviewed the patients History and Physical, chart, labs and discussed the procedure including the risks, benefits and alternatives for the proposed anesthesia with the patient or authorized representative who has indicated his/her understanding and acceptance.       Plan Discussed with: Surgeon  Anesthesia Plan Comments: (Discussed R/B/A of neuraxial anesthesia technique with patient: - rare risks of spinal/epidural hematoma, nerve damage, infection - Risk of PDPH - Risk of itching - Risk of nausea and vomiting - Risk of poor block necessitating replacement of epidural. - Risk of allergic reactions.  Discussed higher risk of poor/nonfunctional epidural due to high BMI. Patient voiced understanding.)         Anesthesia Quick Evaluation

## 2023-03-25 NOTE — Progress Notes (Signed)
Initial telephone report given to Greig Right, RN for pt update and room assignment. OBS4 given to me for pt.  Pt transported via wheelchair to OBS4 and oriented to room and nurse call bell.  Report given to Harless Litten, RN at the nurses station.

## 2023-03-25 NOTE — Progress Notes (Signed)
Labor Progress Note  Catherine Patterson is a 32 y.o. G2P0100 at [redacted]w[redacted]d by LMP admitted for induction of labor due to oligohydramnios, BPP 6/8.  Subjective: she is overall comfortable, feeling contractions as mild cramps  Objective:  Fetal Assessment: FHT:  FHR: 145 bpm, variability: moderate,  accelerations:  Abscent,  decelerations:  Absent Category/reactivity:  Category I UC:   regular, every 4-5 minutes SVE:    Dilation: 3cm  Effacement: 50%  Station:  -1  Consistency: soft  Position: middle  Membrane status:AROM @ 1707 Amniotic color: clear  Labs: Lab Results  Component Value Date   WBC 8.0 03/25/2023   HGB 11.4 (L) 03/25/2023   HCT 33.1 (L) 03/25/2023   MCV 87.8 03/25/2023   PLT 290 03/25/2023    Assessment / Plan: 32 year old G2P0100 at [redacted]w[redacted]d here for IOL for oligohydramnios and BPP 6/8.  Labor:  Progressing normally after 1 dose of cytotec. AROM with clear fluid. Will let her eat dinner then start pitocin.  Attempted to place FSE  Preeclampsia:  no signs or symptoms of toxicity Fetal Wellbeing:  Category I Pain Control:  Labor support without medications I/D:   GBS negative Anticipated MOD:  NSVD  Janyce Llanos, CNM 03/25/2023, 7:48 PM

## 2023-03-25 NOTE — Anesthesia Procedure Notes (Signed)
Epidural Patient location during procedure: OB  Staffing Anesthesiologist: Corinda Gubler, MD Performed: anesthesiologist   Preanesthetic Checklist Completed: patient identified, IV checked, site marked, risks and benefits discussed, surgical consent, monitors and equipment checked, pre-op evaluation and timeout performed  Epidural Patient position: sitting Prep: ChloraPrep Patient monitoring: heart rate, continuous pulse ox and blood pressure Approach: midline Location: L3-L4 Injection technique: LOR saline  Needle:  Needle type: Tuohy  Needle gauge: 17 G Needle length: 9 cm Needle insertion depth: 9 cm Catheter type: closed end flexible Catheter size: 19 Gauge Catheter at skin depth: 15 cm Test dose: negative and 1.5% lidocaine with Epi 1:200 K  Assessment Sensory level: T10 Events: blood not aspirated, no cerebrospinal fluid, injection not painful, no injection resistance, no paresthesia and negative IV test  Additional Notes One/first attempt. Challenging due to body habitus. Pt. Evaluated and documentation done after procedure finished. Patient identified. Risks/Benefits/Options discussed with patient including but not limited to bleeding, infection, nerve damage, paralysis, failed block, incomplete pain control, headache, blood pressure changes, nausea, vomiting, reactions to medication both or allergic, itching and postpartum back pain. Discussed higher risk of poor/nonfunctioning epidural due to high BMI. Confirmed with bedside nurse the patient's most recent platelet count. Confirmed with patient that they are not currently taking any anticoagulation, have any bleeding history or any family history of bleeding disorders. Patient expressed understanding and wished to proceed. All questions were answered. Sterile technique was used throughout the entire procedure. Please see nursing notes for vital signs. Test dose was given through epidural catheter and negative prior to  continuing to dose epidural or start infusion. Warning signs of high block given to the patient including shortness of breath, tingling/numbness in hands, complete motor block, or any concerning symptoms with instructions to call for help. Patient was given instructions on fall risk and not to get out of bed. All questions and concerns addressed with instructions to call with any issues or inadequate analgesia.     Patient tolerated the insertion well without immediate complications.  Reason for block: procedure for painReason for block:procedure for pain

## 2023-03-26 ENCOUNTER — Other Ambulatory Visit: Payer: Medicaid Other

## 2023-03-26 ENCOUNTER — Encounter
Admission: RE | Disposition: A | Discharge status: Home / Self Care | Payer: Self-pay | Source: Home / Self Care | Attending: Obstetrics

## 2023-03-26 LAB — CBC
HCT: 33.8 % — ABNORMAL LOW (ref 36.0–46.0)
Hemoglobin: 11.6 g/dL — ABNORMAL LOW (ref 12.0–15.0)
MCH: 30.1 pg (ref 26.0–34.0)
MCHC: 34.3 g/dL (ref 30.0–36.0)
MCV: 87.8 fL (ref 80.0–100.0)
Platelets: 257 10*3/uL (ref 150–400)
RBC: 3.85 MIL/uL — ABNORMAL LOW (ref 3.87–5.11)
RDW: 12.7 % (ref 11.5–15.5)
WBC: 12.4 10*3/uL — ABNORMAL HIGH (ref 4.0–10.5)
nRBC: 0 % (ref 0.0–0.2)

## 2023-03-26 LAB — RPR: RPR Ser Ql: NONREACTIVE

## 2023-03-26 LAB — CREATININE, SERUM
Creatinine, Ser: 0.76 mg/dL (ref 0.44–1.00)
GFR, Estimated: >60 mL/min (ref >60)

## 2023-03-26 LAB — GLUCOSE, CAPILLARY: Glucose-Capillary: 94 mg/dL (ref 70–99)

## 2023-03-26 SURGERY — Surgical Case
Anesthesia: Spinal

## 2023-03-26 MED ORDER — ONDANSETRON HCL 4 MG/2ML IJ SOLN
INTRAMUSCULAR | Status: DC | PRN
  Administered 2023-03-26: 4 mg via INTRAVENOUS

## 2023-03-26 MED ORDER — BUPIVACAINE HCL (PF) 0.5 % IJ SOLN
INTRAMUSCULAR | Status: AC
  Filled 2023-03-26: qty 60

## 2023-03-26 MED ORDER — BUPIVACAINE IN DEXTROSE 0.75-8.25 % IT SOLN
INTRATHECAL | Status: DC | PRN
  Administered 2023-03-26: 1.5 mL via INTRATHECAL

## 2023-03-26 MED ORDER — SODIUM CHLORIDE 0.9% FLUSH: 3.0000 mL | INTRAVENOUS | Status: DC | PRN

## 2023-03-26 MED ORDER — ENOXAPARIN SODIUM 80 MG/0.8ML IJ SOSY
80.0000 mg | PREFILLED_SYRINGE | INTRAMUSCULAR | Status: DC
  Administered 2023-03-27 – 2023-03-29 (×3): 80 mg via SUBCUTANEOUS
  Filled 2023-03-26 (×3): qty 0.8

## 2023-03-26 MED ORDER — FERROUS SULFATE 325 (65 FE) MG PO TABS
325.0000 mg | ORAL_TABLET | Freq: Two times a day (BID) | ORAL | Status: DC
  Administered 2023-03-26 – 2023-03-29 (×6): 325 mg via ORAL
  Filled 2023-03-26 (×6): qty 1

## 2023-03-26 MED ORDER — SOD CITRATE-CITRIC ACID 500-334 MG/5ML PO SOLN
30.0000 mL | ORAL | Status: AC
  Administered 2023-03-26: 30 mL via ORAL

## 2023-03-26 MED ORDER — ACETAMINOPHEN 500 MG PO TABS
1000.0000 mg | ORAL_TABLET | Freq: Four times a day (QID) | ORAL | Status: DC
  Administered 2023-03-26: 1000 mg via ORAL
  Filled 2023-03-26: qty 2

## 2023-03-26 MED ORDER — ACETAMINOPHEN 500 MG PO TABS
1000.0000 mg | ORAL_TABLET | Freq: Four times a day (QID) | ORAL | Status: DC
  Administered 2023-03-26 – 2023-03-27 (×2): 1000 mg via ORAL
  Filled 2023-03-26 (×2): qty 2

## 2023-03-26 MED ORDER — GABAPENTIN 300 MG PO CAPS
300.0000 mg | ORAL_CAPSULE | Freq: Every day | ORAL | Status: DC
  Administered 2023-03-26 – 2023-03-28 (×3): 300 mg via ORAL
  Filled 2023-03-26 (×3): qty 1

## 2023-03-26 MED ORDER — OXYCODONE HCL 5 MG PO TABS
5.0000 mg | ORAL_TABLET | ORAL | Status: DC | PRN
  Administered 2023-03-28: 5 mg via ORAL
  Filled 2023-03-26: qty 1

## 2023-03-26 MED ORDER — SCOPOLAMINE 1 MG/3DAYS TD PT72: 1.0000 | MEDICATED_PATCH | Freq: Once | TRANSDERMAL | Status: DC

## 2023-03-26 MED ORDER — MENTHOL 3 MG MT LOZG: 1.0000 | LOZENGE | OROMUCOSAL | Status: DC | PRN

## 2023-03-26 MED ORDER — KETOROLAC TROMETHAMINE 30 MG/ML IJ SOLN: 30.0000 mg | Freq: Four times a day (QID) | INTRAMUSCULAR | Status: DC | PRN

## 2023-03-26 MED ORDER — SENNOSIDES-DOCUSATE SODIUM 8.6-50 MG PO TABS
2.0000 | ORAL_TABLET | ORAL | Status: DC
  Administered 2023-03-27 – 2023-03-28 (×2): 2 via ORAL
  Filled 2023-03-26 (×3): qty 2

## 2023-03-26 MED ORDER — CEFAZOLIN IN SODIUM CHLORIDE 3-0.9 GM/100ML-% IV SOLN
3.0000 g | INTRAVENOUS | Status: DC
  Filled 2023-03-26: qty 100

## 2023-03-26 MED ORDER — MEPERIDINE HCL 25 MG/ML IJ SOLN: 6.2500 mg | INTRAMUSCULAR | Status: DC | PRN

## 2023-03-26 MED ORDER — PHENYLEPHRINE HCL-NACL 20-0.9 MG/250ML-% IV SOLN
INTRAVENOUS | Status: DC | PRN
  Administered 2023-03-26: 30 ug/min via INTRAVENOUS
  Administered 2023-03-26: 50 ug/min via INTRAVENOUS

## 2023-03-26 MED ORDER — KETOROLAC TROMETHAMINE 30 MG/ML IJ SOLN
30.0000 mg | Freq: Four times a day (QID) | INTRAMUSCULAR | Status: DC | PRN
  Administered 2023-03-26: 30 mg via INTRAVENOUS
  Filled 2023-03-26: qty 1

## 2023-03-26 MED ORDER — MEASLES, MUMPS & RUBELLA VAC IJ SOLR: 0.5000 mL | Freq: Once | INTRAMUSCULAR | Status: DC

## 2023-03-26 MED ORDER — SIMETHICONE 80 MG PO CHEW: 80.0000 mg | CHEWABLE_TABLET | ORAL | Status: DC | PRN

## 2023-03-26 MED ORDER — OXYTOCIN-SODIUM CHLORIDE 30-0.9 UT/500ML-% IV SOLN: 2.5000 [IU]/h | INTRAVENOUS | Status: DC

## 2023-03-26 MED ORDER — KETOROLAC TROMETHAMINE 30 MG/ML IJ SOLN
30.0000 mg | Freq: Four times a day (QID) | INTRAMUSCULAR | Status: DC
  Administered 2023-03-26 – 2023-03-27 (×2): 30 mg via INTRAVENOUS
  Filled 2023-03-26 (×2): qty 1

## 2023-03-26 MED ORDER — WITCH HAZEL-GLYCERIN EX PADS: 1.0000 | MEDICATED_PAD | CUTANEOUS | Status: DC | PRN

## 2023-03-26 MED ORDER — SOD CITRATE-CITRIC ACID 500-334 MG/5ML PO SOLN
ORAL | Status: AC
  Filled 2023-03-26: qty 15

## 2023-03-26 MED ORDER — BUPIVACAINE HCL (PF) 0.25 % IJ SOLN
INTRAMUSCULAR | Status: DC | PRN
  Administered 2023-03-26: 30 mL

## 2023-03-26 MED ORDER — FENTANYL CITRATE (PF) 100 MCG/2ML IJ SOLN
INTRAMUSCULAR | Status: AC
  Filled 2023-03-26: qty 2

## 2023-03-26 MED ORDER — DIBUCAINE (PERIANAL) 1 % EX OINT: 1.0000 | TOPICAL_OINTMENT | CUTANEOUS | Status: DC | PRN

## 2023-03-26 MED ORDER — SIMETHICONE 80 MG PO CHEW
80.0000 mg | CHEWABLE_TABLET | Freq: Three times a day (TID) | ORAL | Status: DC
  Administered 2023-03-26 – 2023-03-29 (×8): 80 mg via ORAL
  Filled 2023-03-26 (×8): qty 1

## 2023-03-26 MED ORDER — ALBUTEROL SULFATE (2.5 MG/3ML) 0.083% IN NEBU: 3.0000 mL | INHALATION_SOLUTION | Freq: Four times a day (QID) | RESPIRATORY_TRACT | Status: DC | PRN

## 2023-03-26 MED ORDER — SODIUM CHLORIDE 0.9 % IV SOLN
500.0000 mg | INTRAVENOUS | Status: AC
  Administered 2023-03-26: 500 mg via INTRAVENOUS
  Filled 2023-03-26: qty 5

## 2023-03-26 MED ORDER — DIPHENHYDRAMINE HCL 25 MG PO CAPS: 25.0000 mg | ORAL_CAPSULE | ORAL | Status: DC | PRN

## 2023-03-26 MED ORDER — PRENATAL MULTIVITAMIN CH
1.0000 | ORAL_TABLET | Freq: Every day | ORAL | Status: DC
  Administered 2023-03-27 – 2023-03-28 (×2): 1 via ORAL
  Filled 2023-03-26 (×2): qty 1

## 2023-03-26 MED ORDER — NALOXONE HCL 4 MG/10ML IJ SOLN: 1.0000 ug/kg/h | INTRAVENOUS | Status: DC | PRN

## 2023-03-26 MED ORDER — MORPHINE SULFATE (PF) 0.5 MG/ML IJ SOLN
INTRAMUSCULAR | Status: DC | PRN
  Administered 2023-03-26: .1 mg via EPIDURAL

## 2023-03-26 MED ORDER — DIPHENHYDRAMINE HCL 25 MG PO CAPS: 25.0000 mg | ORAL_CAPSULE | Freq: Four times a day (QID) | ORAL | Status: DC | PRN

## 2023-03-26 MED ORDER — MORPHINE SULFATE (PF) 0.5 MG/ML IJ SOLN
INTRAMUSCULAR | Status: AC
  Filled 2023-03-26: qty 10

## 2023-03-26 MED ORDER — BISACODYL 10 MG RE SUPP: 10.0000 mg | Freq: Every day | RECTAL | Status: DC | PRN

## 2023-03-26 MED ORDER — TETANUS-DIPHTH-ACELL PERTUSSIS 5-2.5-18.5 LF-MCG/0.5 IM SUSY: 0.5000 mL | PREFILLED_SYRINGE | Freq: Once | INTRAMUSCULAR | Status: DC

## 2023-03-26 MED ORDER — NALOXONE HCL 0.4 MG/ML IJ SOLN: 0.4000 mg | INTRAMUSCULAR | Status: DC | PRN

## 2023-03-26 MED ORDER — PHENYLEPHRINE HCL-NACL 20-0.9 MG/250ML-% IV SOLN
INTRAVENOUS | Status: AC
  Filled 2023-03-26: qty 250

## 2023-03-26 MED ORDER — IBUPROFEN 600 MG PO TABS: 600.0000 mg | ORAL_TABLET | Freq: Four times a day (QID) | ORAL | Status: DC

## 2023-03-26 MED ORDER — COCONUT OIL OIL
1.0000 | TOPICAL_OIL | Status: DC | PRN
  Filled 2023-03-26: qty 15

## 2023-03-26 MED ORDER — DIPHENHYDRAMINE HCL 50 MG/ML IJ SOLN: 12.5000 mg | INTRAMUSCULAR | Status: DC | PRN

## 2023-03-26 MED ORDER — FENTANYL CITRATE (PF) 100 MCG/2ML IJ SOLN
INTRAMUSCULAR | Status: DC | PRN
  Administered 2023-03-26: 15 ug via INTRATHECAL

## 2023-03-26 MED ORDER — FLEET ENEMA 7-19 GM/118ML RE ENEM: 1.0000 | ENEMA | Freq: Every day | RECTAL | Status: DC | PRN

## 2023-03-26 MED ORDER — CHLORHEXIDINE GLUCONATE 0.12 % MT SOLN
OROMUCOSAL | Status: AC
  Administered 2023-03-26: 15 mL
  Filled 2023-03-26: qty 15

## 2023-03-26 MED ORDER — DEXTROSE 5 % IV SOLN
INTRAVENOUS | Status: DC | PRN
  Administered 2023-03-26: 3 g via INTRAVENOUS

## 2023-03-26 MED ORDER — ONDANSETRON HCL 4 MG/2ML IJ SOLN: 4.0000 mg | Freq: Three times a day (TID) | INTRAMUSCULAR | Status: DC | PRN

## 2023-03-26 MED ORDER — LACTATED RINGERS IV SOLN: INTRAVENOUS | Status: DC

## 2023-03-26 MED ORDER — PHENYLEPHRINE 80 MCG/ML (10ML) SYRINGE FOR IV PUSH (FOR BLOOD PRESSURE SUPPORT)
PREFILLED_SYRINGE | INTRAVENOUS | Status: DC | PRN
  Administered 2023-03-26: 80 ug via INTRAVENOUS

## 2023-03-26 SURGICAL SUPPLY — 30 items
CHLORAPREP W/TINT 26 (MISCELLANEOUS) ×1 IMPLANT
DRSG TELFA 3X8 NADH STRL (GAUZE/BANDAGES/DRESSINGS) ×1 IMPLANT
ELECT REM PT RETURN 9FT ADLT (ELECTROSURGICAL) ×1
ELECTRODE REM PT RTRN 9FT ADLT (ELECTROSURGICAL) ×1 IMPLANT
GAUZE SPONGE 4X4 12PLY STRL (GAUZE/BANDAGES/DRESSINGS) ×1 IMPLANT
GOWN STRL REUS W/ TWL LRG LVL3 (GOWN DISPOSABLE) ×3 IMPLANT
GOWN STRL REUS W/TWL LRG LVL3 (GOWN DISPOSABLE) ×3
MANIFOLD NEPTUNE II (INSTRUMENTS) ×1 IMPLANT
MAT PREVALON FULL STRYKER (MISCELLANEOUS) ×1 IMPLANT
NDL HYPO 25GX1X1/2 BEV (NEEDLE) ×1 IMPLANT
NEEDLE HYPO 25GX1X1/2 BEV (NEEDLE) ×1 IMPLANT
NS IRRIG 1000ML POUR BTL (IV SOLUTION) ×1 IMPLANT
PACK C SECTION AR (MISCELLANEOUS) ×1 IMPLANT
PAD OB MATERNITY 4.3X12.25 (PERSONAL CARE ITEMS) ×1 IMPLANT
PAD PREP 24X41 OB/GYN DISP (PERSONAL CARE ITEMS) ×1 IMPLANT
RETRACTOR TRAXI PANNICULUS (MISCELLANEOUS) IMPLANT
RTRCTR C-SECT PINK 34CM XLRG (MISCELLANEOUS) IMPLANT
SCRUB CHG 4% DYNA-HEX 4OZ (MISCELLANEOUS) ×1 IMPLANT
STAPLER INSORB 30 2030 C-SECTI (MISCELLANEOUS) IMPLANT
SUT MNCRL 4-0 (SUTURE) ×1
SUT MNCRL 4-0 27XMFL (SUTURE) ×1
SUT VIC AB 0 CT1 36 (SUTURE) ×2 IMPLANT
SUT VIC AB 0 CTX 36 (SUTURE) ×2
SUT VIC AB 0 CTX36XBRD ANBCTRL (SUTURE) ×2 IMPLANT
SUT VIC AB 2-0 SH 27 (SUTURE) ×2
SUT VIC AB 2-0 SH 27XBRD (SUTURE) ×2 IMPLANT
SUTURE MNCRL 4-0 27XMF (SUTURE) ×1 IMPLANT
SYR 30ML LL (SYRINGE) ×2 IMPLANT
TRAP FLUID SMOKE EVACUATOR (MISCELLANEOUS) ×1 IMPLANT
WATER STERILE IRR 500ML POUR (IV SOLUTION) ×1 IMPLANT

## 2023-03-26 NOTE — Progress Notes (Signed)
PHARMACIST - PHYSICIAN COMMUNICATION  CONCERNING:  Enoxaparin (Lovenox) for DVT Prophylaxis    RECOMMENDATION: Patient was prescribed enoxaprin  q24 hours for VTE prophylaxis.   Filed Weights   03/25/23 1106  Weight: (!) 165.6 kg (365 lb 0.8 oz)    Body mass index is 55.51 kg/m.  CrCl cannot be calculated (Patient's most recent lab result is older than the maximum 21 days allowed.).   Based on [facility] policy patient is candidate for enoxaparin 0.5mg /kg TBW SQ every 24 hours based on BMI being >30.   DESCRIPTION: Pharmacy has adjusted enoxaparin dose per [facility] policy.  Patient is now receiving enoxaparin 80 mg every 24 hours    Sharen Hones, PharmD Clinical Pharmacist  03/26/2023 12:21 PM

## 2023-03-26 NOTE — Op Note (Addendum)
Cesarean Section Procedure Note  Date of procedure: 03/26/2023   Pre-operative Diagnosis: Intrauterine pregnancy at [redacted]w[redacted]d;  - Oligohydramnios - Persistent Cat II strip, remote from delivery  Body mass index is 55.51 kg/m.  Post-operative Diagnosis: same, delivered. - occiput posterior position **Modified 22 for difficult case due to habitus  Skilled surgical assistance required for this difficult case.  Procedure: Primary Low Transverse Cesarean Section through Pfannenstiel incision  Surgeon: Christeen Douglas, MD  Assistant(s):  Chari Manning, CNM   Anesthesia: Epidural anesthesia, Local anesthesia 0.5% bupivacaine, and Spinal anesthesia  Anesthesiologist: Reed Breech, MD Anesthesiologist: Reed Breech, MD; Corinda Gubler, MD CRNA: Jaye Beagle, CRNA; Michelet, Judeth Cornfield, CRNA  Estimated Blood Loss:           Drains: Foley Cath  Dressings: Provena wound vac placed         Total IV Fluids:  Urine Output:         Specimens: Placenta         Complications:  None; patient tolerated the procedure well.         Disposition: PACU - hemodynamically stable.         Condition: stable  Findings:  A female infant "Arabella Merles" in cephalic OP presentation. Amniotic fluid - Clear  Birth weight 3210 g.  Apgars of 9 and 9 at one and five minutes respectively.  Intact placenta with a three-vessel cord.  Grossly normal uterus, tubes and ovaries bilaterally. No intraabdominal adhesions were noted.  Case was difficult due to pannus positioning. Traxi used. Alexsis used.  31yo G2P0100 at [redacted]w[redacted]d iol for oligohydramnios <5cm and BPP 6/8. Persistent Cat II strip with signs of placental insufficiency remote from delivery.  Indications: malpresentation: OP and non-reassuring fetal status  Procedure Details  The patient was taken to Operating Room, identified as the correct patient and the procedure verified as C-Section Delivery. A formal Time Out was  held with all team members present and in agreement.  After induction of anesthesia, the patient was draped and prepped in the usual sterile manner. A Traxi pannus retractor was placed. A Pfannenstiel skin incision was made and carried down through the subcutaneous tissue to the fascia. Fascial incision was made and extended transversely with the Mayo scissors. The fascia was separated from the underlying rectus tissue superiorly and inferiorly. The peritoneum was identified and entered bluntly. Peritoneal incision was extended longitudinally. The utero-vesical peritoneal reflection was incised transversely and a bladder flap was created digitally.   A low transverse hysterotomy was made. The fetus was delivered atraumatically. The umbilical cord was clamped x2 and cut and the infant was handed to the awaiting pediatricians. The placenta was removed intact and appeared normal, intact, and with a 3-vessel cord.   The uterus was exteriorized and cleared of all clot and debris. The hysterotomy was closed with running sutures of 0-Vicryl. A second imbricating layer was placed with the same suture. Excellent hemostasis was observed. The peritoneal cavity was cleared of all clots and debris. The uterus was returned to the abdomen.   The pelvis was examined and again, excellent hemostasis was noted. The fascia was then reapproximated with running sutures of 0 Maxon. The subcutaneous tissue was reapproximated with running sutures of 0 chromic. The skin was reapproximated with Ensorb absorbable sutures.  20ml (in 30 of 0.5% bupivicaine and 50ml of NSS) of liposomal bupivicaine placed in the fascial and skin lines.  Instrument, sponge, and needle counts were correct prior to the abdominal closure and at  the conclusion of the case.   The patient tolerated the procedure well and was transferred to the recovery room in stable condition.   Christeen Douglas, MD 03/26/2023

## 2023-03-26 NOTE — Lactation Note (Signed)
This note was copied from a baby's chart. Lactation Consultation Note  Patient Name: Catherine Patterson [id number] Date: 03/26/2023 Age:33 hours Reason for consult: Initial assessment;Primapara;Early term 37-38.6wks;Breastfeeding assistance;RN request;Other (Comment) (C/S)   Maternal Data Mom with C/S due to fetal intolerance to labor. IOL for oligohydramnios. Per chart review mom with history of preterm delivery at 22 week, baby lived 5 minutes. Mom with history of obesity, GDM(diet controlled),anemia with pica.  Requested by care nurse to assist breastfeeding mother. Baby had breastfed x3 since birth. Baby's blood sugar check per care nurse was 39 and baby was not latching and was sleepy at the breast. Breastfeeding attempt made. Baby would latch and did not suckle. Breastfeeding attempts made at both breasts.Care nurse and [facility] assisted mom with hand expression. At this time mom very fatigued and was unable to express any colostrum. Baby put skin to skin for 15 minutes and reattempt at breastfeeding in 15 minutes would be made. Discussed with care nurse if baby would not latch and breastfeed after 15 minutes of skin to skin to offer baby supplemental milk and mom can begin breastpumping. Mom in agreement with plan.  Has patient been taught Hand Expression?: Yes Does the patient have breastfeeding experience prior to this delivery?: No  Feeding Mother's Current Feeding Choice: Breast Milk Nipple Type: Slow - flow  LATCH Score Latch: Repeated attempts needed to sustain latch, nipple held in mouth throughout feeding, stimulation needed to elicit sucking reflex.  Audible Swallowing: None  Type of Nipple: Everted at rest and after stimulation  Comfort (Breast/Nipple): Soft / non-tender  Hold (Positioning): Assistance needed to correctly position infant at breast and maintain latch.  LATCH Score: 6   Interventions Interventions: Assisted with latch;Breast massage;Breast  compression;Adjust position;Support pillows;Education;Skin to skin   Consult Status Consult Status: Follow-up Date: 03/27/23 Follow-up type: In-patient  Care nurse at bedside and in attendance during breastfeeding attempt.  Fuller Song 03/26/2023, 5:02 PM

## 2023-03-26 NOTE — Anesthesia Procedure Notes (Signed)
Spinal  Patient location during procedure: OB Start time: 03/26/2023 10:27 AM Staffing Performed: anesthesiologist  Performed by: Jaye Beagle, CRNA Authorized by: Reed Breech, MD   Preanesthetic Checklist Completed: patient identified, IV checked, site marked, risks and benefits discussed, surgical consent, monitors and equipment checked, pre-op evaluation and timeout performed Spinal Block Patient position: sitting Prep: ChloraPrep Patient monitoring: heart rate, cardiac monitor, continuous pulse ox and blood pressure Approach: midline Location: L3-4 Needle Needle type: Pencan  Needle gauge: 22 G Needle length: 5 cm Assessment Events: CSF return Additional Notes Epidural dc'd with tip intact.  Attempt by CRNA.  Successful by Dr Ronni Rumble.  Negative heme, negative paresthesia, no pain with injection

## 2023-03-26 NOTE — Progress Notes (Signed)
Labor Progress Note  Catherine Patterson is a 32 y.o. G2P0100 at [redacted]w[redacted]d by LMP admitted for induction of labor due to oligohydramnios, BPP 6/8.  Subjective: she is feeling occasional pressure  Objective: BP 130/70 (BP Location: Right Arm)   Pulse (!) 111   Temp 97.9 F (36.6 C) (Oral)   Resp 18   Ht  (1.727 m)   Wt (!) 165.6 kg   LMP 07/09/2022   SpO2 98%   BMI 55.51 kg/m  Notable VS details: reviewed  Fetal Assessment: FHT:  FHR: 155 bpm, variability: moderate,  accelerations:  Abscent,  decelerations:  Present intermittent late decelerations Category/reactivity:  Category II UC:   irregular, every 6 minutes SVE:    Dilation: 6cm  Effacement: 90%  Station:  -2  Consistency: soft  Position: middle  Membrane status:AROM @ 1707 Amniotic color: clear  Labs: Lab Results  Component Value Date   WBC 8.0 03/25/2023   HGB 11.4 (L) 03/25/2023   HCT 33.1 (L) 03/25/2023   MCV 87.8 03/25/2023   PLT 290 03/25/2023    Assessment / Plan: 31 year old G2P0100 at [redacted]w[redacted]d here for IOL for oligohydramnios and BPP 6/8.  Labor:  No labor progress since 2343, pitocin was turned off overnight. Requested RN to restart pitocin once fetal heart tracing reassuring; pitocin briefly restarted at 0540 and stopped at 0548 for late decelerations. Pitocin restarted at 0805 and currently 45mu/min. Discussed with patient she has made no change over night, but her MVUs are not adequate. As long as baby tolerates pitocin titration, she would like to continue trying for a labor induction. Dr. Dalbert Garnet and Dr. Jean Rosenthal updated on plan of care.   Preeclampsia:  labs stable Fetal Wellbeing:  Category II, overall reassuring, rare late decelerations, moderate variability, ok to restart pitocin Pain Control:  Epidural I/D:   GBS negative  Janyce Llanos, CNM 03/26/2023, 8:01 AM

## 2023-03-26 NOTE — Transfer of Care (Signed)
Immediate Anesthesia Transfer of Care Note  Patient: Catherine Patterson  Procedure(s) Performed: CESAREAN SECTION  Patient Location:  L &D  Anesthesia Type:Spinal  Level of Consciousness: awake, alert , and oriented  Airway & Oxygen Therapy: Patient Spontanous Breathing  Post-op Assessment: Report given to RN and Post -op Vital signs reviewed and stable  Post vital signs: Reviewed and stable  Last Vitals:  Vitals Value Taken Time  BP 127/78 03/26/23 1201  Temp    Pulse 87 03/26/23 1201  Resp 17 03/26/23 1201  SpO2 94 % 03/26/23 1201    Last Pain:  Vitals:   03/26/23 0723  TempSrc: Oral  PainSc:          Complications: No notable events documented.

## 2023-03-26 NOTE — Progress Notes (Signed)
Assuming care: Cat II strip with min variabiltiy, 15x15 accels rarely and occassional late contractions. Pit of 2, but with continued lates and no further cervical dilation, I recommend a cesarean section. Stop pitocin now.  31yo G2P0100 at [redacted]w[redacted]d with iol for oligo and BPP 6/8.  The risks of cesarean section discussed with the patient included but were not limited to: bleeding which may require transfusion or reoperation; infection which may require antibiotics; injury to bowel, bladder, ureters or other surrounding organs; injury to the fetus; need for additional procedures including hysterectomy in the event of a life-threatening hemorrhage; placental abnormalities wth subsequent pregnancies, incisional problems, thromboembolic phenomenon and other postoperative/anesthesia complications. The patient concurred with the proposed plan, giving informed written consent for the procedure.  Preoperative prophylactic antibiotics and SCDs ordered on call to the OR.  To OR when ready.

## 2023-03-26 NOTE — Discharge Summary (Signed)
Obstetrical Discharge Summary  Patient Name: Catherine Patterson DOB: [birth year] MRN: [id number]  Date of Admission: 03/25/2023 Date of Delivery: 03/26/23 Delivered by: Christeen Douglas, MD MPH Date of Discharge: 03/29/2023   Primary OB: [facility] [id number] last menstrual period was 07/09/2022. EDC Estimated Date of Delivery: 04/15/23 Gestational Age at Delivery: [redacted]w[redacted]d   Antepartum complications:  1. Preterm delivery @ 22wks, baby lived . Cerclage placed and removed at 36wks 2. Obesity, NOB BMI 48, current BMI 55 3. 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 GDM, diet controlled  Admitting Diagnosis: Oligohydramnios [O41.00X0]  Secondary Diagnosis: Patient Active Problem List   Diagnosis Date Noted   Oligohydramnios 03/25/2023   Cervical cerclage suture present, third trimester 03/06/2023   Prior poor obstetrical history (hx of 22w IUFD), antepartum, third trimester 03/06/2023   Diet controlled gestational diabetes mellitus (GDM), antepartum 12/18/2022   Supervision of high risk pregnancy in second trimester 02/07/2021   Obesity affecting pregnancy (PGBMI 48) 07/18/2020    Discharge Diagnosis: Term Pregnancy Delivered      Augmentation: AROM and Pitocin Complications: None Intrapartum complications/course: Recurrent lates with min var Delivery Type: primary cesarean section, low transverse incision Anesthesia: epidural anesthesia, spinal anesthesia Placenta: spontaneous and manual removal To Pathology: Yes  Newborn Data: Live born female "Catherine Patterson" Birth Weight: 7 lb 1.2 oz (3210 g) APGAR: 9, 9  Newborn Delivery   Birth date/time: 03/26/2023 11:00:00 Delivery type: C-Section, Low Transverse Trial of labor: Yes C-section categorization: Primary      Postpartum Procedures: none Edinburgh:     03/27/2023   10:15 PM  Edinburgh Postnatal Depression Scale Screening Tool  I have been able to laugh and see the funny side of things. 0   I have looked forward with enjoyment to things. 0  I have blamed myself unnecessarily when things went wrong. 2  I have been anxious or worried for no good reason. 1  I have felt scared or panicky for no good reason. 1  Things have been getting on top of me. 1  I have been so unhappy that I have had difficulty sleeping. 0  I have felt sad or miserable. 0  I have been so unhappy that I have been crying. 0  The thought of harming myself has occurred to me. 0  Edinburgh Postnatal Depression Scale Total 5     (Cesarean Section):  Patient had an uncomplicated postpartum course.  By time of discharge on POD#3, her pain was controlled on oral pain medications; she had appropriate lochia and was ambulating, voiding without difficulty, tolerating regular diet and passing flatus.   She was deemed stable for discharge to home.    Discharge Physical Exam:  BP 134/81 (BP Location: Right Arm)   Pulse 90   Temp 97.8 F (36.6 C) (Oral)   Resp 20   Ht 5\' 8"  (1.727 m)   Wt (!) 165.6 kg   LMP 07/09/2022   SpO2 97%   Breastfeeding Unknown   BMI 55.51 kg/m   General: NAD CV: RRR Pulm: CTABL, nl effort ABD: s/nd/nt, fundus firm and below the umbilicus Lochia: moderate Perineum:minimal edema/intact Incision: c/d/I, covered with occlusive OP site dressing  DVT Evaluation: LE non-ttp, no evidence of DVT on exam.  Hemoglobin  Date Value Ref Range Status  03/27/2023 9.5 (L) 12.0 - 15.0 g/dL Final   HCT  Date Value Ref Range Status  03/27/2023 28.8 (L) 36.0 - 46.0 % Final    Risk assessment for postpartum VTE and prophylactic  treatment: Very high risk factors: BMI > 50 kg/m2 High risk factors: Unscheduled cesarean after labor  Moderate risk factors: None  Postpartum VTE prophylaxis with LMWH ordered  Disposition: stable, discharge to home. Baby Feeding: breast feeding Baby Disposition: home with mom  Rh Immune globulin indicated: No Rubella vaccine given: was not indicated Varivax  vaccine given: was not indicated Flu vaccine given in AP setting: No Tdap vaccine given in AP setting: Yes   Contraception: no method  Prenatal Labs:   Blood type/Rh A pos  Antibody screen neg  Rubella Immune  Varicella Immune  RPR NR  HBsAg Neg  HIV NR  GC neg  Chlamydia neg  Genetic screening negative  1 hour GTT 147  3 hour GTT 103, 193, 162, 79  GBS Negative     Plan:  Jasie Coates-[name] was discharged to home in good condition. Follow-up appointment with delivering provider in 2 and  6 weeks.  Discharge Medications: Allergies as of 03/29/2023       Reactions   Cherry Itching, Swelling   Throat swells        Medication List     STOP taking these medications    aspirin EC 81 MG tablet   calcium carbonate 500 MG chewable tablet Commonly known as: TUMS - dosed in mg elemental calcium   cyclobenzaprine 10 MG tablet Commonly known as: FLEXERIL   progesterone 200 MG Supp   promethazine 12.5 MG tablet Commonly known as: PHENERGAN       TAKE these medications    acetaminophen 500 MG tablet Commonly known as: TYLENOL Take 2 tablets (1,000 mg total) by mouth every 6 (six) hours. What changed:  how much to take when to take this reasons to take this   coconut oil Oil Apply 1 Application topically as needed.   dibucaine 1 % Oint Commonly known as: NUPERCAINAL Place 1 Application rectally as needed (postpartum hemorrhoids).   enoxaparin 80 MG/0.8ML injection Commonly known as: LOVENOX Inject 0.4 mLs (40 mg total) into the skin daily for 21 days. Start taking on: March 30, 2023   ferrous sulfate 325 (65 FE) MG tablet Take 1 tablet (325 mg total) by mouth 2 (two) times daily with a meal.   fluconazole 150 MG tablet Commonly known as: DIFLUCAN Take 1 tablet (150 mg total) by mouth once for 1 dose. Can take additional dose three days later if symptoms persist   ibuprofen 600 MG tablet Commonly known as: ADVIL Take 1 tablet (600 mg  total) by mouth every 6 (six) hours.   oxyCODONE 5 MG immediate release tablet Commonly known as: Oxy IR/ROXICODONE Take 1-2 tablets (5-10 mg total) by mouth every 4 (four) hours as needed for moderate pain.   prenatal multivitamin Tabs tablet Take 1 tablet by mouth daily at 12 noon.   senna-docusate 8.6-50 MG tablet Commonly known as: Senokot-S Take 2 tablets by mouth daily.   simethicone 80 MG chewable tablet Commonly known as: MYLICON Chew 1 tablet (80 mg total) by mouth as needed for flatulence.   Ventolin HFA 108 (90 Base) MCG/ACT inhaler Generic drug: albuterol Inhale 2 puffs into the lungs every 6 (six) hours as needed.   witch hazel-glycerin pad Commonly known as: TUCKS Apply 1 Application topically as needed for hemorrhoids.         Follow-up Information     Chari Manning Rolla Plate, CNM. Go on 04/01/2023.   Specialty: Obstetrics Why: for wound check and blood pressure check on 04/01/2023 at 1:45  pm. Contact information: [address] [place] [address] [phone]         Christeen Douglas, MD. Go on 04/11/2023.   Specialty: Obstetrics and Gynecology Why: post-op incision check on 04/11/2023 at 11:30 am Contact information: [address] [place] [address] [phone]                 Signed: Chari Manning CNM

## 2023-03-27 ENCOUNTER — Encounter: Payer: Self-pay | Admitting: Obstetrics and Gynecology

## 2023-03-27 LAB — CBC
HCT: 28.8 % — ABNORMAL LOW (ref 36.0–46.0)
Hemoglobin: 9.5 g/dL — ABNORMAL LOW (ref 12.0–15.0)
MCH: 30.1 pg (ref 26.0–34.0)
MCHC: 33 g/dL (ref 30.0–36.0)
MCV: 91.1 fL (ref 80.0–100.0)
Platelets: 227 10*3/uL (ref 150–400)
RBC: 3.16 MIL/uL — ABNORMAL LOW (ref 3.87–5.11)
RDW: 13 % (ref 11.5–15.5)
WBC: 13 10*3/uL — ABNORMAL HIGH (ref 4.0–10.5)
nRBC: 0 % (ref 0.0–0.2)

## 2023-03-27 MED ORDER — IBUPROFEN 600 MG PO TABS
600.0000 mg | ORAL_TABLET | Freq: Four times a day (QID) | ORAL | Status: DC
  Administered 2023-03-27 – 2023-03-29 (×7): 600 mg via ORAL
  Filled 2023-03-27 (×7): qty 1

## 2023-03-27 MED ORDER — ACETAMINOPHEN 500 MG PO TABS
1000.0000 mg | ORAL_TABLET | Freq: Four times a day (QID) | ORAL | Status: DC
  Administered 2023-03-27 – 2023-03-29 (×9): 1000 mg via ORAL
  Filled 2023-03-27 (×9): qty 2

## 2023-03-27 MED ORDER — KETOROLAC TROMETHAMINE 30 MG/ML IJ SOLN
30.0000 mg | Freq: Four times a day (QID) | INTRAMUSCULAR | Status: AC
  Administered 2023-03-27 (×2): 30 mg via INTRAVENOUS
  Filled 2023-03-27 (×2): qty 1

## 2023-03-27 NOTE — Lactation Note (Signed)
This note was copied from a baby's chart. Lactation Consultation Note  Patient Name: Catherine Patterson ZOXWR'U Date: 03/27/2023 Age:33 hours Reason for consult: Follow-up assessment;Primapara;Early term 27-38.6wks   Maternal Data Mom with C/S due to fetal intolerance to labor. IOL for oligohydramnios. Per chart review mom with history of preterm delivery at 22 week, baby lived 5 minutes. Mom with history of obesity, GDM(diet controlled),anemia with pica.   Baby received glucose gel and formula due to low blood sugar last evening . Baby has been breastfeeding and bottle feeding well since that time. Today on follow-up mom reports she has been breastfeeding and offering the bottle after breastfeeding during the day today and last night baby bottle fed formula.. She noted the baby gets sleepy after about 10 minutes at the breast so she stops and gives her the bottle.   Discussed with mom characteristics of early term babies and the potential for inconsistent breastfeeding until the baby's original due date. Reviewed early term feeding plan with mom. Mom verbalized understanding and is in agreement with plan.  Has patient been taught Hand Expression?: Yes Does the patient have breastfeeding experience prior to this delivery?: No  Feeding Mother's Current Feeding Choice: Breast Milk and Formula Nipple Type: Slow - flow   Lactation Tools Discussed/Used  DEBP, manual harmony pump  Interventions Interventions: Breast feeding basics reviewed;Hand pump;Education;DEBP (Provided and reviewed written early term feeeding plan.) Early term feeding plan provided to mom: - Offer baby to breastfeed at least 8 feeds/24 hours. Baby may want as many as 12 feeds in 24 hours. Offer baby to eat whenever baby shows feeding cues like hands to mouth, sucking sounds, light sleep or fussiness. - If your baby has not shown feeding cues and it has been 3  hours from the beginning of the previous feeding  unwrap the baby, remove some clothing, change the diaper. - Mom can also hand express a few drops of colostrum/breastmilk before latching the baby to the breast. This may help with encouraging the baby to latch.  - Once latched mom will observe baby is actively feeding and will look and listen for swallows. -After baby completes feeding at the 1st breast mom will burp the baby and offer the 2nd breast. - If the baby falls asleep despite encouraging the baby to feed or will not latch and mom has tried for 10 minutes mom will stop the breastfeeding attempt. Mom can pump and dad/grandma can provide any expressed breastmilk and the formula supplement by slow flow bottle.  -Freshly pumped breastmilk is good at room temperature for 4 hours, in the refrigerator for 4 days, and in the freezer for 6 months. Once the baby has drank from the bottle discard any leftover milk 1 hour from when the baby began drinking from the bottle. -Continue to use a slow flow bottle when supplementing baby. -Supplementation amounts offered to baby: 0-24 hours of life offer 5-10 ml's, 24-48 hours of life offer 10-20 ml's, 48-72 hours offer 20-30 ml's and after 72 hours offer a minimum of 30 ml's and the baby may take more. Follow baby's signs for satiety. -Remember to burp the baby frequently following her cues for slowing down or not continuing to drink. -Goal to complete baby's feeding within no more than 30 to 40 minutes.  - Once mom's milk transitions in with larger volumes baby will take less and less of the supplemental feeding after breastfeeding and will refuse additional supplemental milk after breastfeeding. - Until mom's milk supply  is established mom can post pump until baby is consistently breastfeeding. If baby does not breastfeed at a feeding session it is important for mom to insure she pumps.  -It is recommended to keep feeding diary and wet/stool diapers and take to first pediatric check-up. -Once home if baby  refuses 2 feedings in a row (6 hours) or you note no stool diapers in 24 hours call the baby's pediatrician. If you need additional breastfeeding assistance once you go home you can call [facility] lactation at [phone] for questions and to make an outpatient [facility] appointment.  Discharge Discharge Education: Engorgement and breast care;Outpatient recommendation;Warning signs for feeding baby Pump: Manual (Mom to discuss with midwife tomorrow am form she needed filled out again to obtain her breastpump via insurance. Mom also was on [facility] but did not continue with [facility] program but is planning to reconnect with WIC. Pump referral faxed to [facility]) Faxed referral to [facility] on [address] [phone].  Consult Status Consult Status: Follow-up Date: 03/28/23 Follow-up type: In-patient  Update provided to care nurse.  Fuller Song 03/27/2023, 5:04 PM

## 2023-03-27 NOTE — Anesthesia Post-op Follow-up Note (Signed)
  Anesthesia Pain Follow-up Note  Patient: Catherine Patterson  Day #: 1  Date of Follow-up: 03/27/2023 Time: 8:43 AM  Last Vitals:  Vitals:   03/27/23 0700 03/27/23 0800  BP:  118/62  Pulse: 96 82  Resp:  18  Temp:  36.5 C  SpO2: 95% 96%    Level of Consciousness: alert  Pain: mild   Side Effects:None  Catheter Site Exam:clean, dry, no drainage  Anti-Coag Meds (From admission, onward)    Start     Dose/Rate Route Frequency Ordered Stop   03/27/23 0800  enoxaparin (LOVENOX) injection 80 mg        80 mg Subcutaneous Every 24 hours 03/26/23 1210          Plan: D/C from anesthesia care at surgeon's request  Shanyia Stines Lawerance Cruel

## 2023-03-27 NOTE — Anesthesia Postprocedure Evaluation (Signed)
Anesthesia Post Note  Patient: Catherine Patterson  Procedure(s) Performed: CESAREAN SECTION  Patient location during evaluation: Mother Baby Anesthesia Type: Spinal Level of consciousness: oriented and awake and alert Pain management: pain level controlled Vital Signs Assessment: post-procedure vital signs reviewed and stable Respiratory status: spontaneous breathing and respiratory function stable Cardiovascular status: blood pressure returned to baseline and stable Postop Assessment: no headache, no backache, no apparent nausea or vomiting and able to ambulate Anesthetic complications: no   No notable events documented.   Last Vitals:  Vitals:   03/27/23 0700 03/27/23 0800  BP:  118/62  Pulse: 96 82  Resp:  18  Temp:  36.5 C  SpO2: 95% 96%    Last Pain:  Vitals:   03/27/23 0822  TempSrc:   PainSc: 2                  Nuala Chiles Lawerance Cruel

## 2023-03-27 NOTE — Progress Notes (Addendum)
Post Partum Day 1  Subjective: no complaints  Doing well, no concerns. Ambulating without difficulty, pain managed with PO meds, tolerating regular diet, and voiding without difficulty.   No fever/chills, chest pain, shortness of breath, nausea/vomiting, or leg pain. No nipple or breast pain. No headache, visual changes, or RUQ/epigastric pain.  Objective: BP 118/62 (BP Location: Left Arm)   Pulse 82   Temp 97.7 F (36.5 C) (Oral)   Resp 18   Ht  (1.727 m)   Wt (!) 165.6 kg   LMP 07/09/2022   SpO2 96%   Breastfeeding Unknown   BMI 55.51 kg/m    Physical Exam:  General: alert and cooperative Breasts: soft/nontender CV: RRR Pulm: nl effort Abdomen: soft, non-tender Uterine Fundus: firm Incision: dressing c/d/i Perineum: intact Lochia: appropriate DVT Evaluation: No evidence of DVT seen on physical exam. Edinburgh:      No data to display           Recent Labs    03/26/23 1236 03/27/23 0457  HGB 11.6* 9.5*  HCT 33.8* 28.8*  WBC 12.4* 13.0*  PLT 257 227    Assessment/Plan: 32 y.o. G2P1101 postpartum day # 1  -Continue routine postpartum care -Lactation consult PRN for breastfeeding  -Acute blood loss anemia - hemodynamically stable and asymptomatic; start PO ferrous sulfate BID with stool softeners  -Immunization status:  all immunizations up to date  Disposition: Continue inpatient postpartum care    LOS: 2 days   Carroll Lingelbach, CNM 03/27/2023, 11:01 AM

## 2023-03-28 LAB — URINALYSIS, COMPLETE (UACMP) WITH MICROSCOPIC
Bacteria, UA: NONE SEEN
Bilirubin Urine: NEGATIVE
Glucose, UA: NEGATIVE mg/dL
Ketones, ur: NEGATIVE mg/dL
Leukocytes,Ua: NEGATIVE
Nitrite: NEGATIVE
Protein, ur: NEGATIVE mg/dL
RBC / HPF: >50 RBC/hpf (ref 0–5)
Specific Gravity, Urine: 1.019 (ref 1.005–1.030)
pH: 6 (ref 5.0–8.0)

## 2023-03-28 LAB — SURGICAL PATHOLOGY

## 2023-03-28 NOTE — Progress Notes (Signed)
Postop Day  2  Subjective: no complaints, up ad lib, voiding, and tolerating PO  Doing well. Ambulating without difficulty, pain managed with PO meds, tolerating regular diet, and voiding without difficulty.  Does report burning with urination and increase in frequency.   No fever/chills, chest pain, shortness of breath, nausea/vomiting, or leg pain. No nipple or breast pain. No headache, visual changes, or RUQ/epigastric pain.  Objective: BP 136/81 (BP Location: Right Arm)   Pulse 83   Temp 97.6 F (36.4 C) (Oral)   Resp 18   Ht  (1.727 m)   Wt (!) 165.6 kg   LMP 07/09/2022   SpO2 97%   Breastfeeding Unknown   BMI 55.51 kg/m   Vitals:   03/26/23 1319 03/26/23 1330 03/26/23 1345 03/26/23 1400  BP: 103/64 (!) 141/95 (!) 139/91 (!) 142/72   03/26/23 1455 03/26/23 2113 03/27/23 0026 03/27/23 0404  BP: 128/77 128/75 120/68 129/77   03/27/23 0800 03/27/23 1533 03/27/23 2340 03/28/23 0817  BP: 118/62 118/77 136/70 136/81     Physical Exam:  General: alert, cooperative, and no distress Breasts: soft/nontender CV: RRR Pulm: nl effort, CTABL Abdomen: soft, non-tender, active bowel sounds Uterine Fundus: firm Incision: no significant drainage, covered with occlusive OP site  Perineum: minimal edema, intact Lochia: appropriate DVT Evaluation: No evidence of DVT seen on physical exam.  Recent Labs    03/26/23 1236 03/27/23 0457  HGB 11.6* 9.5*  HCT 33.8* 28.8*  WBC 12.4* 13.0*  PLT 257 227    Assessment/Plan: 32 y.o. G2P1101 postpartum day # 2  -Continue routine postpartum care -Lactation consult PRN for breastfeeding  -Acute blood loss anemia - hemodynamically stable and asymptomatic; start PO ferrous sulfate BID with stool softeners  -Immunization status:   all immunizations up to date -UA ordered for UTI symptoms    Disposition: Continue inpatient postpartum care    LOS: 3 days   Gustavo Lah, CNM 03/28/2023, 10:54 AM   ----- Margaretmary Eddy  Certified  Nurse Midwife [facility]

## 2023-03-28 NOTE — Progress Notes (Signed)
Patient to do a clean catch void for UTI testing. RN assisted in set up in the bathroom to help but as soon as patient sat down, she couldn't wait and voided without having wiped and cleaned. She apologized and complained that she's been dealing with this urgency lately. She will try again

## 2023-03-29 MED ORDER — DIBUCAINE (PERIANAL) 1 % EX OINT: 1.0000 | TOPICAL_OINTMENT | CUTANEOUS | Status: AC | PRN

## 2023-03-29 MED ORDER — SIMETHICONE 80 MG PO CHEW: 80.0000 mg | CHEWABLE_TABLET | ORAL | 0 refills | Status: AC | PRN

## 2023-03-29 MED ORDER — SENNOSIDES-DOCUSATE SODIUM 8.6-50 MG PO TABS: 2.0000 | ORAL_TABLET | ORAL | Status: AC

## 2023-03-29 MED ORDER — ACETAMINOPHEN 500 MG PO TABS: 1000.0000 mg | ORAL_TABLET | Freq: Four times a day (QID) | ORAL | 0 refills | Status: AC

## 2023-03-29 MED ORDER — IBUPROFEN 600 MG PO TABS: 600.0000 mg | ORAL_TABLET | Freq: Four times a day (QID) | ORAL | 0 refills | Status: AC

## 2023-03-29 MED ORDER — OXYCODONE HCL 5 MG PO TABS: 5.0000 mg | ORAL_TABLET | ORAL | 0 refills | Status: AC | PRN

## 2023-03-29 MED ORDER — FERROUS SULFATE 325 (65 FE) MG PO TABS: 325.0000 mg | ORAL_TABLET | Freq: Two times a day (BID) | ORAL | 3 refills | Status: AC

## 2023-03-29 MED ORDER — COCONUT OIL OIL: 1.0000 | TOPICAL_OIL | 0 refills | Status: AC | PRN

## 2023-03-29 MED ORDER — WITCH HAZEL-GLYCERIN EX PADS: 1.0000 | MEDICATED_PAD | CUTANEOUS | 12 refills | Status: AC | PRN

## 2023-03-29 MED ORDER — FLUCONAZOLE 150 MG PO TABS: 150.0000 mg | ORAL_TABLET | Freq: Once | ORAL | 0 refills | Status: AC

## 2023-03-29 MED ORDER — ENOXAPARIN SODIUM 80 MG/0.8ML IJ SOSY: 40.0000 mg | PREFILLED_SYRINGE | INTRAMUSCULAR | 0 refills | Status: AC

## 2023-03-29 NOTE — Lactation Note (Signed)
This note was copied from a baby's chart. Lactation Consultation Note  Patient Name: Catherine Patterson ZOXWR'U Date: 03/29/2023 Age:33 hours Reason for consult: Follow-up assessment;Primapara;Early term 37-38.6wks;Maternal discharge   Maternal Data Has patient been taught Hand Expression?: Yes Does the patient have breastfeeding experience prior to this delivery?: No  Feeding Mother's Current Feeding Choice: Breast Milk and Formula Mom was able to latch baby to both breasts at last feeding, baby nursed 25 min total, mom encouraged, she formula fed after    LATCH Score Latch:  (I did not observe a feeding)                  Lactation Tools Discussed/Used  Mom does not have a breast pump at home, unable to get to [place] [employer] today, she would like a loaner symphony pump until Monday when she can go to [facility], rental agreement completed and mom loaned Symphony # [phone] with instructions to return pump by Monday April 29, given lactation phone no and copy of rental agreement.      Interventions Interventions:  (loaned DEBP until mom can get pump from [facility] in [place]  Discharge Pump: Rented;WIC Loaner WIC Program: Yes  Consult Status Consult Status: PRN Date: 03/29/23 Follow-up type: In-patient    Dyann Kief 03/29/2023, 12:37 PM

## 2023-03-29 NOTE — Progress Notes (Signed)
Patient discharged home with infant. Discharge instructions and prescriptions given and reviewed with patient. Patient verbalized understanding. Escorted out by volunteer.  

## 2023-03-29 NOTE — Discharge Instructions (Signed)
Discharge Instructions:  ° °If there are any new medications, they have been ordered and will be available for pickup at the listed pharmacy on your way home from the hospital.  ° °Call office if you have any of the following: headache, visual changes, fever >101.0 F, chills, shortness of breath, breast concerns, excessive vaginal bleeding, incision drainage or problems, leg pain or redness, depression or any other concerns. If you have vaginal discharge with an odor, let your doctor know.  ° °It is normal to bleed for up to 6 weeks. You should not soak through more than 1 pad in 1 hour. If you have a blood clot larger than your fist with continued bleeding, call your doctor.  ° °After a c-section, you should expect a small amount of blood or clear fluid coming from the incision and abdominal cramping/soreness. Inspect your incision site daily. Stand in front of a mirror to look for any redness, incision opening, or discolored/odorness drainage. Take a shower daily and continue good hygiene. Use own towel and washcloth (do not share). Make sure your sheets on your bed are clean. No pets sleeping around your incision site. Dressing will be removed at your postpartum visit. If the dressing does become wet or soiled underneath, it is okay to remove it.  ° °On-Q pump: You will remove on day 5 after insertion or if the ball becomes flat before day 5. You will remove on: Apr 05, 2019 ° °Activity: Do not lift > 10 lbs for 6 weeks (do not lift anything heavier than your baby). °No intercourse, tampons, swimming pools, hot tubs, baths (only showers) for 6 weeks.  °No driving for 1-2 weeks. °Continue prenatal vitamin, especially if breastfeeding. °Increase calories and fluids (water) while breastfeeding.  ° °Your milk will come in, in the next couple of days (right now it is colostrum). You may have a slight fever when your milk comes in, but it should go away on its own.  If it does not, and rises above 101 F please call the  doctor. You will also feel achy and your breasts will be firm. They will also start to leak. If you are breastfeeding, continue as you have been and you can pump/express milk for comfort.  ° °If you have too much milk, your breasts can become engorged, which could lead to mastitis. This is an infection of the milk ducts. It can be very painful and you will need to notify your doctor to obtain a prescription for antibiotics. You can also treat it with a shower or hot/cold compress.  ° °For concerns about your baby, please call your pediatrician.  °For breastfeeding concerns, the lactation consultant can be reached at [phone].  ° °Postpartum blues (feelings of happy one minute and sad another minute) are normal for the first few weeks but if it gets worse let your doctor know.  ° °Congratulations! We enjoyed caring for you and your new bundle of joy!  °

## 2023-04-01 ENCOUNTER — Other Ambulatory Visit: Payer: Medicaid Other

## 2023-04-02 ENCOUNTER — Telehealth: Payer: Self-pay

## 2023-04-02 ENCOUNTER — Other Ambulatory Visit: Payer: Medicaid Other

## 2023-04-02 NOTE — Telephone Encounter (Signed)
[facility]- Discharge Call Backs-Spoke to patient on the phone about the following below. 1-Do you have any questions or concerns about yourself as you heal?  C-Sec-Is your dressing off?yes & healing well. 2-Any concerns or questions about your baby? No 3-Reviewed ABC's of safe sleep. 4-How was your stay at the hospital?Great 5- Did our team work together to care for you?Yes You should be receiving a survey in the mail soon.   We would really appreciate it if you could fill that out for [place] and return it in the mail.  We value the feedback to make improvements and continue the great work we do.   If you have any questions please feel free to call me back at [phone]

## 2023-04-08 ENCOUNTER — Other Ambulatory Visit: Payer: Medicaid Other

## 2023-04-09 ENCOUNTER — Other Ambulatory Visit: Payer: Medicaid Other

## 2023-05-15 ENCOUNTER — Ambulatory Visit: Payer: Self-pay

## 2023-05-15 NOTE — Lactation Note (Addendum)
This note was copied from a baby's chart. Lactation Consultation Note  Patient Name: Catherine Patterson [id number] Date: 05/15/2023 Age:33 wk.o.  Reason for consult: mother's request, concerns for low milk supply, feeding assessment with pre/post weight   Maternal Data  Catherine Patterson is 71 weeks old and was born early term at 57 1/7 weeks and was born by C/S due to fetal intolerance to labor. IOL of mom for oligohydramnios. Baby was discharged to home with early term feeding plan as baby was inconsistently breastfeeding. Mom's feeding goal on admission was breastfeeding and formula. Per chart review mom with history of preterm delivery at 22 week, baby lived 5 minutes. Mom with history of obesity, GDM(diet controlled),anemia with pica.   At today's visit mom reported she is not sure how much milk Catherine Patterson gets when breastfeeding as baby isn't satisfied after breastfeeding. Mom supplements baby after every breastfeeding and on average Catherine Patterson takes 2-3 ounces per bottle feed and 2 times a day may take 1 ounce after breastfeeding which is mostly after the first am breastfeeding sessions. Baby has adequate wet and stool diapers.Per mom baby has appropriately gaining weight when seen by Pediatrician. Mom reports baby has a pediatric follow-up appointment on the 19th.Today's weight baby is 4,442 grams. Per mom she was not able to keep up with pumping in the early post partum period due to fatigue and caring for her baby. Currently when she does pump she gets anywhere from 1/2 ounce to 2 total ounces, she is not pumping often. Mom reports challenges with remembering to eat and drink fluids.  Feeding   Mom positioned baby well in cradle hold baby latches well. Baby suckled at the right breast for 10 minutes, 2 audible swallows were noted. By post weight baby transferred 2 ml's. Mom offered baby left breast and baby latched for 15 minutes, intermittent swallows were noted and by post weight baby transferred 12 ml's for a  total feed at the breast of 14 ml's. Post breastfeeding baby took 2 ounces of formula supplement.  LATCH Score :*  Latch=2 Audible swallowing:1 Type of nipple:2  Comfort: 1 Hold: 2   Mom can position and latch baby well. Baby with a few total swallows due to mom's low milk supply.     Lactation Tools Discussed/Used  Attempted SNS at mom's left breast. Baby consistently latched only to the tube and would not remain latched on to the nipple.   Interventions  Reviewed with mom how the body knows to make milk and the importance of consistent pumping especially in the early post partum period. As mom is 7 weeks post partum recommended mom post pump after breastfeeding/breastfeeding attempts for 72 hours to determine the impact to her milk supply in an attempt to improve her milk supply. Mom reports she gets too fatigued to pump at night. Recommended mom pump in day and evening hours and if she is able to she can add a pump session when awake at night. Encouraged mom to increase fluid and nourishment intake, provided some tips to improve mom's intake.Recommended mom when breastfeeding to massage her breast. Support and encouragement provided and discussed any breastmilk mom can provide Catherine be beneficial to her baby. Discussed with mom to complete feeding breast and bottle in a time frame of no more than 30-40 minutes. Recommended mom observe and listen for baby's swallows and if baby has stopped swallowing and is fussy at the breast to complete the breastfeeding and offer the baby supplemental milk by bottle. Recommended  mom continue to offer baby a minimum of 8 feeds/24 hours with feeding cues and to continue to provide supplemental milk after breastfeeding and follow baby's cues for satiety.  Discharge  Mom has Catherine Patterson loaner DEBP.  Consult Status  Complete Outpatient 05/15/23   Catherine Patterson 05/15/2023, 3:50 PM
